# Patient Record
Sex: Female | Born: 1950 | Race: Black or African American | Hispanic: No | Marital: Single | Smoking: Current every day smoker
Health system: Southern US, Community
[De-identification: ages and names within clinical notes are randomized; demographics above are authoritative.]

## PROBLEM LIST (undated history)

## (undated) DIAGNOSIS — J449 Chronic obstructive pulmonary disease, unspecified: Secondary | ICD-10-CM

## (undated) DIAGNOSIS — I10 Essential (primary) hypertension: Secondary | ICD-10-CM

## (undated) HISTORY — PX: FOOT SURGERY: SHX648

---

## 1998-09-01 ENCOUNTER — Inpatient Hospital Stay (HOSPITAL_COMMUNITY): Admission: AD | Admit: 1998-09-01 | Discharge: 1998-09-01 | Payer: Self-pay | Admitting: Obstetrics & Gynecology

## 1998-09-12 ENCOUNTER — Encounter: Admission: RE | Admit: 1998-09-12 | Discharge: 1998-09-12 | Payer: Self-pay | Admitting: Obstetrics & Gynecology

## 1998-09-14 ENCOUNTER — Encounter: Admission: RE | Admit: 1998-09-14 | Discharge: 1998-09-14 | Payer: Self-pay | Admitting: Obstetrics

## 1998-09-25 ENCOUNTER — Ambulatory Visit (HOSPITAL_COMMUNITY): Admission: RE | Admit: 1998-09-25 | Discharge: 1998-09-25 | Payer: Self-pay | Admitting: Obstetrics

## 1998-09-28 ENCOUNTER — Ambulatory Visit (HOSPITAL_COMMUNITY): Admission: RE | Admit: 1998-09-28 | Discharge: 1998-09-28 | Payer: Self-pay | Admitting: Obstetrics

## 2001-12-05 ENCOUNTER — Encounter: Payer: Self-pay | Admitting: Emergency Medicine

## 2001-12-05 ENCOUNTER — Emergency Department (HOSPITAL_COMMUNITY): Admission: EM | Admit: 2001-12-05 | Discharge: 2001-12-05 | Payer: Self-pay | Admitting: Emergency Medicine

## 2002-06-02 ENCOUNTER — Encounter: Payer: Self-pay | Admitting: Internal Medicine

## 2002-06-02 ENCOUNTER — Ambulatory Visit (HOSPITAL_COMMUNITY): Admission: RE | Admit: 2002-06-02 | Discharge: 2002-06-02 | Payer: Self-pay | Admitting: Internal Medicine

## 2002-07-09 ENCOUNTER — Encounter: Payer: Self-pay | Admitting: Emergency Medicine

## 2002-07-09 ENCOUNTER — Emergency Department (HOSPITAL_COMMUNITY): Admission: EM | Admit: 2002-07-09 | Discharge: 2002-07-09 | Payer: Self-pay | Admitting: Emergency Medicine

## 2002-10-13 ENCOUNTER — Encounter: Payer: Self-pay | Admitting: Internal Medicine

## 2002-10-13 ENCOUNTER — Ambulatory Visit (HOSPITAL_COMMUNITY): Admission: RE | Admit: 2002-10-13 | Discharge: 2002-10-13 | Payer: Self-pay | Admitting: Internal Medicine

## 2002-10-25 ENCOUNTER — Emergency Department (HOSPITAL_COMMUNITY): Admission: EM | Admit: 2002-10-25 | Discharge: 2002-10-25 | Payer: Self-pay | Admitting: Emergency Medicine

## 2003-04-02 ENCOUNTER — Emergency Department (HOSPITAL_COMMUNITY): Admission: EM | Admit: 2003-04-02 | Discharge: 2003-04-02 | Payer: Self-pay | Admitting: Emergency Medicine

## 2007-06-24 ENCOUNTER — Emergency Department (HOSPITAL_COMMUNITY): Admission: EM | Admit: 2007-06-24 | Discharge: 2007-06-24 | Payer: Self-pay | Admitting: Emergency Medicine

## 2007-07-10 ENCOUNTER — Emergency Department (HOSPITAL_COMMUNITY): Admission: EM | Admit: 2007-07-10 | Discharge: 2007-07-10 | Payer: Self-pay | Admitting: Emergency Medicine

## 2008-05-17 ENCOUNTER — Emergency Department (HOSPITAL_COMMUNITY): Admission: EM | Admit: 2008-05-17 | Discharge: 2008-05-17 | Payer: Self-pay | Admitting: Family Medicine

## 2008-06-03 ENCOUNTER — Emergency Department (HOSPITAL_COMMUNITY): Admission: EM | Admit: 2008-06-03 | Discharge: 2008-06-03 | Payer: Self-pay | Admitting: Family Medicine

## 2009-03-10 ENCOUNTER — Emergency Department (HOSPITAL_COMMUNITY): Admission: EM | Admit: 2009-03-10 | Discharge: 2009-03-10 | Payer: Self-pay | Admitting: Emergency Medicine

## 2009-07-31 ENCOUNTER — Emergency Department (HOSPITAL_COMMUNITY): Admission: EM | Admit: 2009-07-31 | Discharge: 2009-07-31 | Payer: Self-pay | Admitting: Emergency Medicine

## 2009-08-07 ENCOUNTER — Emergency Department (HOSPITAL_COMMUNITY)
Admission: EM | Admit: 2009-08-07 | Discharge: 2009-08-07 | Payer: Self-pay | Source: Home / Self Care | Admitting: Emergency Medicine

## 2010-01-02 ENCOUNTER — Emergency Department (HOSPITAL_COMMUNITY)
Admission: EM | Admit: 2010-01-02 | Discharge: 2010-01-02 | Payer: Self-pay | Source: Home / Self Care | Admitting: Family Medicine

## 2010-06-08 ENCOUNTER — Ambulatory Visit (HOSPITAL_BASED_OUTPATIENT_CLINIC_OR_DEPARTMENT_OTHER): Admission: RE | Admit: 2010-06-08 | Payer: Medicare Other | Source: Ambulatory Visit | Admitting: Surgery

## 2010-06-10 ENCOUNTER — Ambulatory Visit (INDEPENDENT_AMBULATORY_CARE_PROVIDER_SITE_OTHER): Payer: Medicare Other

## 2010-06-10 ENCOUNTER — Inpatient Hospital Stay (INDEPENDENT_AMBULATORY_CARE_PROVIDER_SITE_OTHER)
Admission: RE | Admit: 2010-06-10 | Discharge: 2010-06-10 | Disposition: A | Discharge status: Home / Self Care | Payer: Medicare Other | Source: Ambulatory Visit | Attending: Emergency Medicine | Admitting: Emergency Medicine

## 2010-06-10 DIAGNOSIS — M25519 Pain in unspecified shoulder: Secondary | ICD-10-CM

## 2010-10-04 LAB — POCT RAPID STREP A: Streptococcus, Group A Screen (Direct): NEGATIVE

## 2010-11-01 ENCOUNTER — Emergency Department (INDEPENDENT_AMBULATORY_CARE_PROVIDER_SITE_OTHER): Payer: Medicare Other

## 2010-11-01 ENCOUNTER — Encounter: Payer: Self-pay | Admitting: *Deleted

## 2010-11-01 ENCOUNTER — Emergency Department (HOSPITAL_BASED_OUTPATIENT_CLINIC_OR_DEPARTMENT_OTHER)
Admission: EM | Admit: 2010-11-01 | Discharge: 2010-11-01 | Disposition: A | Discharge status: Home / Self Care | Payer: Medicare Other | Attending: Emergency Medicine | Admitting: Emergency Medicine

## 2010-11-01 DIAGNOSIS — M79609 Pain in unspecified limb: Secondary | ICD-10-CM | POA: Insufficient documentation

## 2010-11-01 DIAGNOSIS — R0781 Pleurodynia: Secondary | ICD-10-CM

## 2010-11-01 DIAGNOSIS — J4489 Other specified chronic obstructive pulmonary disease: Secondary | ICD-10-CM | POA: Insufficient documentation

## 2010-11-01 DIAGNOSIS — S139XXA Sprain of joints and ligaments of unspecified parts of neck, initial encounter: Secondary | ICD-10-CM | POA: Insufficient documentation

## 2010-11-01 DIAGNOSIS — M79659 Pain in unspecified thigh: Secondary | ICD-10-CM

## 2010-11-01 DIAGNOSIS — R0789 Other chest pain: Secondary | ICD-10-CM

## 2010-11-01 DIAGNOSIS — F17200 Nicotine dependence, unspecified, uncomplicated: Secondary | ICD-10-CM | POA: Insufficient documentation

## 2010-11-01 DIAGNOSIS — Y92410 Unspecified street and highway as the place of occurrence of the external cause: Secondary | ICD-10-CM | POA: Insufficient documentation

## 2010-11-01 DIAGNOSIS — I10 Essential (primary) hypertension: Secondary | ICD-10-CM | POA: Insufficient documentation

## 2010-11-01 DIAGNOSIS — R079 Chest pain, unspecified: Secondary | ICD-10-CM | POA: Insufficient documentation

## 2010-11-01 DIAGNOSIS — J449 Chronic obstructive pulmonary disease, unspecified: Secondary | ICD-10-CM | POA: Insufficient documentation

## 2010-11-01 DIAGNOSIS — M47812 Spondylosis without myelopathy or radiculopathy, cervical region: Secondary | ICD-10-CM

## 2010-11-01 DIAGNOSIS — M542 Cervicalgia: Secondary | ICD-10-CM

## 2010-11-01 DIAGNOSIS — S161XXA Strain of muscle, fascia and tendon at neck level, initial encounter: Secondary | ICD-10-CM

## 2010-11-01 HISTORY — DX: Chronic obstructive pulmonary disease, unspecified: J44.9

## 2010-11-01 HISTORY — DX: Essential (primary) hypertension: I10

## 2010-11-01 MED ORDER — HYDROCODONE-ACETAMINOPHEN 5-325 MG PO TABS
2.0000 | ORAL_TABLET | Freq: Once | ORAL | Status: AC
  Administered 2010-11-01: 2 via ORAL
  Filled 2010-11-01: qty 2

## 2010-11-01 MED ORDER — HYDROCODONE-ACETAMINOPHEN 5-500 MG PO TABS: 1.0000 | ORAL_TABLET | Freq: Four times a day (QID) | ORAL | Status: AC | PRN

## 2010-11-01 NOTE — ED Notes (Signed)
C-Collar was removed per negative xrays. No change in status. Pt reports "stiffness" but no change in neuro status.

## 2010-11-01 NOTE — ED Notes (Signed)
Pt now report pain to left lateral thigh. Swelling noted. Pt ambulates without difficulty.
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
## 2010-11-01 NOTE — ED Notes (Addendum)
Pt was logrolled off backboard. C-collar remains in place. No change in neuro vascular status.

## 2010-11-01 NOTE — ED Notes (Signed)
Pt seen ambulating around department checking on other family members. Ambulates without difficulty. Does not appear to be in any acute discomfort.

## 2010-11-01 NOTE — ED Notes (Signed)
Pt up ambulating through dept with a steady gait with C-collar in place. NAD noted.

## 2010-11-01 NOTE — ED Notes (Signed)
Pt requesting pain med to take now.

## 2010-11-02 NOTE — ED Provider Notes (Signed)
Medical screening examination/treatment/procedure(s) were performed by non-physician practitioner and as supervising physician I was immediately available for consultation/collaboration.  Glynn Octave, MD 11/02/10 [phone]

## 2012-01-05 ENCOUNTER — Emergency Department (INDEPENDENT_AMBULATORY_CARE_PROVIDER_SITE_OTHER)
Admission: EM | Admit: 2012-01-05 | Discharge: 2012-01-05 | Disposition: A | Discharge status: Home / Self Care | Payer: Medicare Other | Source: Home / Self Care | Attending: Emergency Medicine | Admitting: Emergency Medicine

## 2012-01-05 ENCOUNTER — Encounter (HOSPITAL_COMMUNITY): Payer: Self-pay | Admitting: *Deleted

## 2012-01-05 ENCOUNTER — Emergency Department (INDEPENDENT_AMBULATORY_CARE_PROVIDER_SITE_OTHER): Payer: Medicare Other

## 2012-01-05 DIAGNOSIS — M549 Dorsalgia, unspecified: Secondary | ICD-10-CM

## 2012-01-05 MED ORDER — HYDROCODONE-ACETAMINOPHEN 5-325 MG PO TABS
ORAL_TABLET | ORAL | Status: AC
  Filled 2012-01-05: qty 2

## 2012-01-05 MED ORDER — CYCLOBENZAPRINE HCL 10 MG PO TABS: 10.0000 mg | ORAL_TABLET | Freq: Two times a day (BID) | ORAL | Status: DC | PRN

## 2012-01-05 MED ORDER — HYDROCODONE-ACETAMINOPHEN 5-325 MG PO TABS
2.0000 | ORAL_TABLET | Freq: Once | ORAL | Status: AC
  Administered 2012-01-05: 2 via ORAL

## 2012-01-05 MED ORDER — HYDROCODONE-ACETAMINOPHEN 5-325 MG PO TABS: 2.0000 | ORAL_TABLET | ORAL | Status: DC | PRN

## 2012-01-05 NOTE — ED Provider Notes (Signed)
Medical screening examination/treatment/procedure(s) were performed by non-physician practitioner and as supervising physician I was immediately available for consultation/collaboration.  Leslee Home, M.D.   Reuben Likes, MD 01/05/12 [phone]

## 2012-01-05 NOTE — ED Notes (Signed)
Patient complains of left leg pain that starts at lower back and radiates to lower left leg. Pain started yesterday morning and gradually increased. Pain is worse while standing and walking. Patient denies Nausea, vomiting, diarrhea. Patient states she wants to get a refill on her Combivent.

## 2012-01-05 NOTE — ED Provider Notes (Signed)
History     CSN: [id number]  Arrival date & time 01/05/12  1242   First MD Initiated Contact with Patient 01/05/12 1450      Chief Complaint  Patient presents with  . Leg Pain    (Consider location/radiation/quality/duration/timing/severity/associated sxs/prior treatment) Patient is a 61 y.o. female presenting with back pain.  Back Pain  This is a new problem. The problem occurs constantly. The problem has been gradually worsening. The pain is associated with an MCA. The pain is present in the lumbar spine. The quality of the pain is described as aching and cramping. The pain is at a severity of 8/10. The pain is moderate. She has tried nothing for the symptoms. The treatment provided no relief.    Past Medical History  Diagnosis Date  . Asthma   . Hypertension   . COPD (chronic obstructive pulmonary disease)     Past Surgical History  Procedure Date  . Foot surgery     No family history on file.  History  Substance Use Topics  . Smoking status: Current Every Day Smoker  . Smokeless tobacco: Not on file  . Alcohol Use: Yes     Comment: rarely    OB History    Grav Para Term Preterm Abortions TAB SAB Ect Mult Living                  Review of Systems  Musculoskeletal: Positive for back pain.  All other systems reviewed and are negative.    Allergies  Ampicillin; Aspirin; and Penicillins  Home Medications   Current Outpatient Rx  Name  Route  Sig  Dispense  Refill  . IPRATROPIUM-ALBUTEROL 18-103 MCG/ACT IN AERO   Inhalation   Inhale 2 puffs into the lungs every 6 (six) hours as needed. For shortness of breath          . ESOMEPRAZOLE MAGNESIUM 40 MG PO CPDR   Oral   Take 40 mg by mouth daily.           Marland Kitchen HYDROCHLOROTHIAZIDE PO   Oral   Take 1 tablet by mouth daily.           . VESICARE PO   Oral   Take 1 tablet by mouth daily.             BP 133/78  Pulse 62  Temp 97.7 F (36.5 C) (Oral)  Resp 19  SpO2 98%  Physical Exam    Nursing note and vitals reviewed. Constitutional: She is oriented to person, place, and time. She appears well-developed and well-nourished.  HENT:  Head: Normocephalic.  Right Ear: External ear normal.  Left Ear: External ear normal.  Nose: Nose normal.  Mouth/Throat: Oropharynx is clear and moist.  Eyes: Conjunctivae normal and EOM are normal. Pupils are equal, round, and reactive to light.  Neck: Normal range of motion. Neck supple.  Cardiovascular: Normal rate and normal heart sounds.   Pulmonary/Chest: Effort normal and breath sounds normal.  Abdominal: Soft. Bowel sounds are normal.  Musculoskeletal: Normal range of motion.  Neurological: She is alert and oriented to person, place, and time.  Skin: Skin is warm.  Psychiatric: She has a normal mood and affect.    ED Course  Procedures (including critical care time)  Labs Reviewed - No data to display Dg Lumbar Spine Complete  01/05/2012  *RADIOLOGY REPORT*  Clinical Data: Leg pain.  Sudden onset of lower back pain 1 day ago.  No known injury.  LUMBAR SPINE -  COMPLETE 4+ VIEW  Comparison: None.  Findings: There are five non-rib bearing vertebral bodies.  There is grade 1 retrolisthesis of L5 on S1.  No evidence for acute fracture.  No suspicious lytic or blastic lesions are identified. Note is made of moderate stool burden within the visualized colonic loops.  Visualized portion of the pelvis is unremarkable in appearance.  IMPRESSION:  1.  Grade 1 retrolisthesis of L5 on S1. 2.  No evidence for acute fracture.   Original Report Authenticated By: Norva Pavlov, M.D.      1. Back pain       MDM  Rx for hydrocodone,   Flexeril for spasm.   Follow up with [facility] for evaluation       Elson Areas, [place] 01/05/12 1653

## 2018-01-08 DIAGNOSIS — Z79899 Other long term (current) drug therapy: Secondary | ICD-10-CM | POA: Insufficient documentation

## 2018-01-08 DIAGNOSIS — J449 Chronic obstructive pulmonary disease, unspecified: Secondary | ICD-10-CM | POA: Diagnosis not present

## 2018-01-08 DIAGNOSIS — M545 Low back pain: Secondary | ICD-10-CM | POA: Diagnosis present

## 2018-01-08 DIAGNOSIS — R51 Headache: Secondary | ICD-10-CM | POA: Diagnosis not present

## 2018-01-08 DIAGNOSIS — F17200 Nicotine dependence, unspecified, uncomplicated: Secondary | ICD-10-CM | POA: Diagnosis not present

## 2018-01-08 DIAGNOSIS — J45909 Unspecified asthma, uncomplicated: Secondary | ICD-10-CM | POA: Diagnosis not present

## 2018-01-08 DIAGNOSIS — M542 Cervicalgia: Secondary | ICD-10-CM | POA: Insufficient documentation

## 2018-01-08 DIAGNOSIS — I10 Essential (primary) hypertension: Secondary | ICD-10-CM | POA: Diagnosis not present

## 2018-01-09 ENCOUNTER — Emergency Department (HOSPITAL_COMMUNITY): Payer: 59

## 2018-01-09 ENCOUNTER — Emergency Department (HOSPITAL_COMMUNITY)
Admission: EM | Admit: 2018-01-09 | Discharge: 2018-01-09 | Disposition: A | Discharge status: Home / Self Care | Payer: 59 | Attending: Emergency Medicine | Admitting: Emergency Medicine

## 2018-01-09 ENCOUNTER — Encounter (HOSPITAL_COMMUNITY): Payer: Self-pay | Admitting: *Deleted

## 2018-01-09 ENCOUNTER — Other Ambulatory Visit: Payer: Self-pay

## 2018-01-09 DIAGNOSIS — M545 Low back pain, unspecified: Secondary | ICD-10-CM

## 2018-01-09 MED ORDER — AZITHROMYCIN 250 MG PO TABS: 250.0000 mg | ORAL_TABLET | Freq: Every day | ORAL | 0 refills | Status: DC

## 2018-01-09 MED ORDER — HYDROCODONE-ACETAMINOPHEN 5-325 MG PO TABS: 1.0000 | ORAL_TABLET | Freq: Four times a day (QID) | ORAL | 0 refills | Status: DC | PRN

## 2018-01-09 MED ORDER — HYDROCODONE-ACETAMINOPHEN 5-325 MG PO TABS
2.0000 | ORAL_TABLET | Freq: Once | ORAL | Status: AC
Start: 2018-01-09 — End: 2018-01-09
  Administered 2018-01-09: 2 via ORAL
  Filled 2018-01-09: qty 2

## 2018-01-09 MED ORDER — DOXYCYCLINE HYCLATE 100 MG PO CAPS: 100.0000 mg | ORAL_CAPSULE | Freq: Two times a day (BID) | ORAL | 0 refills | Status: AC
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
## 2018-01-09 NOTE — ED Notes (Signed)
Bed: [facility] Expected date:  Expected time:  Means of arrival:  Comments: 

## 2018-01-09 NOTE — ED Notes (Signed)
Pt removed C-collar c/o "it's digging in my face."  Pt informed of reason for C-collar & accepted responsibility for removing it.

## 2019-02-05 ENCOUNTER — Emergency Department (HOSPITAL_BASED_OUTPATIENT_CLINIC_OR_DEPARTMENT_OTHER)
Admission: EM | Admit: 2019-02-05 | Discharge: 2019-02-05 | Disposition: A | Discharge status: Home / Self Care | Payer: 59 | Attending: Emergency Medicine | Admitting: Emergency Medicine

## 2019-02-05 ENCOUNTER — Encounter (HOSPITAL_BASED_OUTPATIENT_CLINIC_OR_DEPARTMENT_OTHER): Payer: Self-pay

## 2019-02-05 ENCOUNTER — Other Ambulatory Visit: Payer: Self-pay

## 2019-02-05 DIAGNOSIS — F17210 Nicotine dependence, cigarettes, uncomplicated: Secondary | ICD-10-CM | POA: Insufficient documentation

## 2019-02-05 DIAGNOSIS — E876 Hypokalemia: Secondary | ICD-10-CM | POA: Insufficient documentation

## 2019-02-05 DIAGNOSIS — J45909 Unspecified asthma, uncomplicated: Secondary | ICD-10-CM | POA: Insufficient documentation

## 2019-02-05 DIAGNOSIS — Z20822 Contact with and (suspected) exposure to covid-19: Secondary | ICD-10-CM | POA: Insufficient documentation

## 2019-02-05 DIAGNOSIS — B349 Viral infection, unspecified: Secondary | ICD-10-CM

## 2019-02-05 DIAGNOSIS — I10 Essential (primary) hypertension: Secondary | ICD-10-CM | POA: Insufficient documentation

## 2019-02-05 DIAGNOSIS — R7989 Other specified abnormal findings of blood chemistry: Secondary | ICD-10-CM

## 2019-02-05 DIAGNOSIS — J449 Chronic obstructive pulmonary disease, unspecified: Secondary | ICD-10-CM | POA: Insufficient documentation

## 2019-02-05 DIAGNOSIS — R5383 Other fatigue: Secondary | ICD-10-CM | POA: Diagnosis present

## 2019-02-05 LAB — URINALYSIS, ROUTINE W REFLEX MICROSCOPIC
Bilirubin Urine: NEGATIVE
Glucose, UA: NEGATIVE mg/dL
Hgb urine dipstick: NEGATIVE
Ketones, ur: NEGATIVE mg/dL
Leukocytes,Ua: NEGATIVE
Nitrite: NEGATIVE
Protein, ur: NEGATIVE mg/dL
Specific Gravity, Urine: 1.01 (ref 1.005–1.030)
pH: 7.5 (ref 5.0–8.0)

## 2019-02-05 LAB — CBC WITH DIFFERENTIAL/PLATELET
Abs Immature Granulocytes: 0.01 10*3/uL (ref 0.00–0.07)
Basophils Absolute: 0 10*3/uL (ref 0.0–0.1)
Basophils Relative: 0 %
Eosinophils Absolute: 0 10*3/uL (ref 0.0–0.5)
Eosinophils Relative: 0 %
HCT: 42.2 % (ref 36.0–46.0)
Hemoglobin: 13.9 g/dL (ref 12.0–15.0)
Immature Granulocytes: 0 %
Lymphocytes Relative: 34 %
Lymphs Abs: 1.3 10*3/uL (ref 0.7–4.0)
MCH: 30 pg (ref 26.0–34.0)
MCHC: 32.9 g/dL (ref 30.0–36.0)
MCV: 91.1 fL (ref 80.0–100.0)
Monocytes Absolute: 0.3 10*3/uL (ref 0.1–1.0)
Monocytes Relative: 8 %
Neutro Abs: 2.2 10*3/uL (ref 1.7–7.7)
Neutrophils Relative %: 58 %
Platelets: 164 10*3/uL (ref 150–400)
RBC: 4.63 MIL/uL (ref 3.87–5.11)
RDW: 12.9 % (ref 11.5–15.5)
WBC: 3.8 10*3/uL — ABNORMAL LOW (ref 4.0–10.5)
nRBC: 0 % (ref 0.0–0.2)

## 2019-02-05 LAB — BASIC METABOLIC PANEL
Anion gap: 9 (ref 5–15)
BUN: 15 mg/dL (ref 8–23)
CO2: 30 mmol/L (ref 22–32)
Calcium: 8.3 mg/dL — ABNORMAL LOW (ref 8.9–10.3)
Chloride: 96 mmol/L — ABNORMAL LOW (ref 98–111)
Creatinine, Ser: 1.06 mg/dL — ABNORMAL HIGH (ref 0.44–1.00)
GFR calc Af Amer: >60 mL/min (ref >60)
GFR calc non Af Amer: 54 mL/min — ABNORMAL LOW (ref >60)
Glucose, Bld: 94 mg/dL (ref 70–99)
Potassium: 2.8 mmol/L — ABNORMAL LOW (ref 3.5–5.1)
Sodium: 135 mmol/L (ref 135–145)

## 2019-02-05 LAB — SARS CORONAVIRUS 2 AG (30 MIN TAT): SARS Coronavirus 2 Ag: NEGATIVE

## 2019-02-05 MED ORDER — SODIUM CHLORIDE 0.9 % IV BOLUS
500.0000 mL | Freq: Once | INTRAVENOUS | Status: AC
  Administered 2019-02-05: 500 mL via INTRAVENOUS

## 2019-02-05 MED ORDER — POTASSIUM CHLORIDE 10 MEQ/100ML IV SOLN
10.0000 meq | Freq: Once | INTRAVENOUS | Status: AC
  Administered 2019-02-05: 10 meq via INTRAVENOUS
  Filled 2019-02-05: qty 100

## 2019-02-05 MED ORDER — POTASSIUM CHLORIDE ER 10 MEQ PO TBCR: 10.0000 meq | EXTENDED_RELEASE_TABLET | Freq: Every day | ORAL | 0 refills | Status: AC

## 2019-02-05 NOTE — ED Triage Notes (Signed)
Pt arrives to ED with reports of fatigue and diarrhea X3 days. Pt states that last time this happened she was dehydrated.

## 2019-02-05 NOTE — ED Provider Notes (Signed)
[facility] EMERGENCY DEPARTMENT Provider Note   CSN: [id number] Arrival date & time: 02/05/19  1229     History Chief Complaint  Patient presents with  . Fatigue    Kimberly Gibbs is a 69 y.o. female with PMHx asthma, COPD, and HTN who presents to the ED today complaining of gradual onset, constant, fatigue x 2-3 days.  She also complains of chills, nasal congestion, diarrhea.  She does report she has a history of irritable bowel syndrome and states that she does not believe her diarrhea is any worse than normal.  No blood in stool.  States she has been checking her temperature at home and states she has not had a fever.  She reports that approximately 4 years ago she had similar symptoms and was told that she was dehydrated, patient states she thinks she needs fluids.  Denies any recent COVID-19 positive exposure.  Denies fever, headache, cough, shortness of breath, chest pain, abdominal pain, nausea, vomiting, other associated symptoms.   The history is provided by the patient.       Past Medical History:  Diagnosis Date  . Asthma   . COPD (chronic obstructive pulmonary disease) (HCC)   . Hypertension     There are no problems to display for this patient.   Past Surgical History:  Procedure Laterality Date  . FOOT SURGERY       OB History   No obstetric history on file.     No family history on file.  Social History   Tobacco Use  . Smoking status: Current Every Day Smoker    Packs/day: 0.50    Types: Cigarettes  . Smokeless tobacco: Never Used  Substance Use Topics  . Alcohol use: Yes    Comment: rarely  . Drug use: No    Home Medications Prior to Admission medications   Medication Sig Start Date End Date Taking? Authorizing Provider  albuterol-ipratropium (COMBIVENT) 18-103 MCG/ACT inhaler Inhale 2 puffs into the lungs every 6 (six) hours as needed. For shortness of breath     [provider]  cyclobenzaprine (FLEXERIL) 10 MG tablet  Take 1 tablet (10 mg total) by mouth 2 (two) times daily as needed for muscle spasms. 01/05/12   Elson Areas, PA-C  esomeprazole (NEXIUM) 40 MG capsule Take 40 mg by mouth daily.      [provider]  HYDROCHLOROTHIAZIDE PO Take 1 tablet by mouth daily.      [provider]  HYDROcodone-acetaminophen (NORCO/VICODIN) 5-325 MG tablet Take 1 tablet by mouth every 6 (six) hours as needed for severe pain. 01/09/18   Shaune Pollack, MD  potassium chloride (KLOR-CON) 10 MEQ tablet Take 1 tablet (10 mEq total) by mouth daily for 7 days. 02/05/19 02/12/19  Tanda Rockers, PA-C  Solifenacin Succinate (VESICARE PO) Take 1 tablet by mouth daily.      [provider]    Allergies    Ampicillin, Aspirin, and Penicillins  Review of Systems   Review of Systems  Constitutional: Positive for activity change, chills and fatigue. Negative for fever.  HENT: Positive for congestion.   Respiratory: Negative for cough and shortness of breath.   Cardiovascular: Negative for chest pain.  Gastrointestinal: Positive for diarrhea. Negative for abdominal pain, nausea and vomiting.  All other systems reviewed and are negative.   Physical Exam Updated Vital Signs BP (!) 103/48 (BP Location: Right Arm)   Pulse 81   Temp 98.6 F (37 C) (Oral)   Resp 18  Ht 5\' 5"  (1.651 m)   Wt 95.9 kg   SpO2 94%   BMI 35.18 kg/m   Physical Exam Vitals and nursing note reviewed.  Constitutional:      Appearance: She is not ill-appearing or diaphoretic.  HENT:     Head: Normocephalic and atraumatic.  Eyes:     Conjunctiva/sclera: Conjunctivae normal.  Cardiovascular:     Rate and Rhythm: Normal rate and regular rhythm.     Pulses: Normal pulses.  Pulmonary:     Effort: Pulmonary effort is normal.     Breath sounds: Normal breath sounds. No wheezing, rhonchi or rales.  Abdominal:     Palpations: Abdomen is soft.     Tenderness: There is no abdominal tenderness. There is no guarding or  rebound.  Musculoskeletal:     Cervical back: Neck supple.  Skin:    General: Skin is warm and dry.  Neurological:     Mental Status: She is alert.     ED Results / Procedures / Treatments   Labs (all labs ordered are listed, but only abnormal results are displayed) Labs Reviewed  BASIC METABOLIC PANEL - Abnormal; Notable for the following components:      Result Value   Potassium 2.8 (*)    Chloride 96 (*)    Creatinine, Ser 1.06 (*)    Calcium 8.3 (*)    GFR calc non Af Amer 54 (*)    All other components within normal limits  CBC WITH DIFFERENTIAL/PLATELET - Abnormal; Notable for the following components:   WBC 3.8 (*)    All other components within normal limits  SARS CORONAVIRUS 2 AG (30 MIN TAT)  NOVEL CORONAVIRUS, NAA (HOSP ORDER, SEND-OUT TO REF LAB; TAT 18-24 HRS)  URINALYSIS, ROUTINE W REFLEX MICROSCOPIC    EKG None  Radiology No results found.  Procedures Procedures (including critical care time)  Medications Ordered in ED Medications  sodium chloride 0.9 % bolus 500 mL (500 mLs Intravenous New Bag/Given 02/05/19 1508)  potassium chloride 10 mEq in 100 mL IVPB (10 mEq Intravenous New Bag/Given 02/05/19 1548)    ED Course  I have reviewed the triage vital signs and the nursing notes.  Pertinent labs & imaging results that were available during my care of the patient were reviewed by me and considered in my medical decision making (see chart for details).  69 year old female presents the ED today complaining of fatigue, chills, diarrhea, nasal congestion.  States she thinks that she is dehydrated.  She denies any recent COVID-19 positive exposure however with constellation of symptoms there is concern.  Will swab for Covid at this time, obtain screening labs, urinalysis.  Vitals are stable; pt is afebrile, nontachycardic, and nontachypneic. Satting 95% on RA; no complaints of SOB or cough at this time. blood pressure mildly soft in the ED, will recheck.  May  consider giving a mild amount of fluids however with concern for Covid do want to avoid giving excess fluids.   Recheck blood pressure 83/52; will give 500 CCs fluids.   Clinical Course as of Feb 04 1702  Fri Feb 05, 2019  1514 SARS Coronavirus 2 Ag: NEGATIVE [MV]  1532 Potassium(!): 2.8 [MV]    Clinical Course User Index [MV] 1533, PA-C   Potassium 2.8; will replete in the ED and discharge home with same. Suspect depletion s/2 diarrhea. Creatinine mildly elevated at 1.06; pt receiving fluids.   Rapid covid test negative; will order send out test. Remainder of labwork reassuring.  WBC 3.8; consistent with suspected covid 19. Urine without infection.   On reevaluation pt's most recent blood pressure 114/66. Pt reports she is feeling improved and ready to be discharged. She is advised that she will need to stay home and self isolate until she receives her results. She is in understanding that the test can take 2-3 days to return. I will discharge home with [facility]; pt advised she will need to have her potassium and creatinine level rechecked in 1-2 weeks. If positive she needs to self isolate for 14 days. Stable for discharge.   This note was prepared using Dragon voice recognition software and may include unintentional dictation errors due to the inherent limitations of voice recognition software.  Kimberly Gibbs was evaluated in Emergency Department on 02/05/2019 for the symptoms described in the history of present illness. She was evaluated in the context of the global COVID-19 pandemic, which necessitated consideration that the patient might be at risk for infection with the SARS-CoV-2 virus that causes COVID-19. Institutional protocols and algorithms that pertain to the evaluation of patients at risk for COVID-19 are in a state of rapid change based on information released by regulatory bodies including the CDC and federal and state organizations. These policies and algorithms were  followed during the patient's care in the ED.    MDM Rules/Calculators/A&P                       Final Clinical Impression(s) / ED Diagnoses Final diagnoses:  Person under investigation for COVID-19  Viral illness  Hypokalemia  Elevated serum creatinine    Rx / DC Orders ED Discharge Orders         Ordered    potassium chloride (KLOR-CON) 10 MEQ tablet  Daily     02/05/19 1700           Discharge Instructions     Your potassium was mildly low today - we have given you potassium through the IV and have prescribed additional oral potassium to take at home.   Your kidney function (creatinine) was mildly elevated today; likely due to dehydrated.   Please increase your water intake at home for the next few days. You will need to have both your potassium and creatinine level rechecked by your PCP in 1-2 weeks.   You can take your at home Imodium to help with your diarrhea.   We have swabbed you for COVID 19 today - you need to stay home and self isolate until you receive your results (can take up to 2-2 days to return). If negative you may return to work immediately. If positive you will need to self isolate for 14 days (cleared: 02/20/2019). We will call you if you are positive. You can log into your MyChart account to check your results as well.        Eustaquio Maize, PA-C 02/05/19 [place], MD 02/06/19 [phone]

## 2019-02-05 NOTE — Discharge Instructions (Signed)
Your potassium was mildly low today - we have given you potassium through the IV and have prescribed additional oral potassium to take at home.   Your kidney function (creatinine) was mildly elevated today; likely due to dehydrated.   Please increase your water intake at home for the next few days. You will need to have both your potassium and creatinine level rechecked by your PCP in 1-2 weeks.   You can take your at home Imodium to help with your diarrhea.   We have swabbed you for COVID 19 today - you need to stay home and self isolate until you receive your results (can take up to 2-2 days to return). If negative you may return to work immediately. If positive you will need to self isolate for 14 days (cleared: 02/20/2019). We will call you if you are positive. You can log into your MyChart account to check your results as well.

## 2019-02-06 LAB — NOVEL CORONAVIRUS, NAA (HOSP ORDER, SEND-OUT TO REF LAB; TAT 18-24 HRS): SARS-CoV-2, NAA: NOT DETECTED

## 2019-04-22 ENCOUNTER — Other Ambulatory Visit: Payer: Self-pay | Admitting: Oral Surgery
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
## 2020-01-07 ENCOUNTER — Ambulatory Visit (HOSPITAL_COMMUNITY): Admission: EM | Admit: 2020-01-07 | Discharge: 2020-01-07 | Discharge status: Against Medical Advice | Payer: 59

## 2020-01-08 ENCOUNTER — Other Ambulatory Visit: Payer: Self-pay

## 2020-01-08 ENCOUNTER — Ambulatory Visit (HOSPITAL_COMMUNITY)
Admission: EM | Admit: 2020-01-08 | Discharge: 2020-01-08 | Disposition: A | Discharge status: Home / Self Care | Payer: Medicare Other | Attending: Student | Admitting: Student

## 2020-01-08 ENCOUNTER — Encounter (HOSPITAL_COMMUNITY): Payer: Self-pay | Admitting: *Deleted

## 2020-01-08 DIAGNOSIS — R197 Diarrhea, unspecified: Secondary | ICD-10-CM | POA: Insufficient documentation

## 2020-01-08 DIAGNOSIS — J069 Acute upper respiratory infection, unspecified: Secondary | ICD-10-CM | POA: Insufficient documentation

## 2020-01-08 DIAGNOSIS — J449 Chronic obstructive pulmonary disease, unspecified: Secondary | ICD-10-CM | POA: Insufficient documentation

## 2020-01-08 DIAGNOSIS — F17210 Nicotine dependence, cigarettes, uncomplicated: Secondary | ICD-10-CM | POA: Insufficient documentation

## 2020-01-08 DIAGNOSIS — I10 Essential (primary) hypertension: Secondary | ICD-10-CM | POA: Insufficient documentation

## 2020-01-08 DIAGNOSIS — U071 COVID-19: Secondary | ICD-10-CM | POA: Diagnosis not present

## 2020-01-08 LAB — RESP PANEL BY RT-PCR (FLU A&B, COVID) ARPGX2
Influenza A by PCR: NEGATIVE
Influenza B by PCR: NEGATIVE
SARS Coronavirus 2 by RT PCR: POSITIVE — AB

## 2020-01-08 NOTE — ED Provider Notes (Signed)
[facility]    CSN: [id number] Arrival date & time: 01/08/20  1017      History   Chief Complaint Chief Complaint  Patient presents with  . Diarrhea  . Cough  . Fatigue    HPI Kimberly Gibbs is a 70 y.o. female Presenting for URI symptoms for 4 days. History of asthma, COPD, current smoker, hypertension.  Describes 4 days of symptoms. States 1 day ago she had 3 episodes of diarrhea in 1 day; since then denies n/v/d/abd pain, is able to eat and drink normally. Also endorses occ productive cough that seems more like tickel in her throat. Adamantly denies shortness of breath or chest pian; is using her combivent inhaler without issue. Denies fevers/chills, n/v, shortness of breath, chest pain, congestion, facial pain, teeth pain, headaches, sore throat, loss of taste/smell, swollen lymph nodes, ear pain.  Denies chest pain, shortness of breath, confusion, high fevers.  Fully vaccinated for covid-19.   HPI  Past Medical History:  Diagnosis Date  . Asthma   . COPD (chronic obstructive pulmonary disease) (HCC)   . Hypertension     There are no problems to display for this patient.   Past Surgical History:  Procedure Laterality Date  . FOOT SURGERY      OB History   No obstetric history on file.      Home Medications    Prior to Admission medications   Medication Sig Start Date End Date Taking? Authorizing Provider  albuterol-ipratropium (COMBIVENT) 18-103 MCG/ACT inhaler Inhale 2 puffs into the lungs every 6 (six) hours as needed. For shortness of breath     [provider]  cyclobenzaprine (FLEXERIL) 10 MG tablet Take 1 tablet (10 mg total) by mouth 2 (two) times daily as needed for muscle spasms. 01/05/12   Elson Areas, PA-C  esomeprazole (NEXIUM) 40 MG capsule Take 40 mg by mouth daily.      [provider]  HYDROCHLOROTHIAZIDE PO Take 1 tablet by mouth daily.      [provider]  HYDROcodone-acetaminophen (NORCO/VICODIN)  5-325 MG tablet Take 1 tablet by mouth every 6 (six) hours as needed for severe pain. 01/09/18   Shaune Pollack, MD  potassium chloride (KLOR-CON) 10 MEQ tablet Take 1 tablet (10 mEq total) by mouth daily for 7 days. 02/05/19 02/12/19  Tanda Rockers, PA-C  Solifenacin Succinate (VESICARE PO) Take 1 tablet by mouth daily.      [provider]    Family History History reviewed. No pertinent family history.  Social History Social History   Tobacco Use  . Smoking status: Current Every Day Smoker    Packs/day: 0.50    Types: Cigarettes  . Smokeless tobacco: Never Used  Substance Use Topics  . Alcohol use: Yes    Comment: rarely  . Drug use: No     Allergies   Aspirin, Latex, Penicillins, and Ampicillin   Review of Systems Review of Systems  Constitutional: Positive for fatigue. Negative for appetite change, chills and fever.  HENT: Negative for congestion, ear pain, rhinorrhea, sinus pressure, sinus pain and sore throat.   Eyes: Negative for redness and visual disturbance.  Respiratory: Positive for cough. Negative for chest tightness, shortness of breath and wheezing.   Cardiovascular: Negative for chest pain and palpitations.  Gastrointestinal: Positive for diarrhea. Negative for abdominal pain, constipation, nausea and vomiting.  Genitourinary: Negative for dysuria, frequency and urgency.  Musculoskeletal: Negative for myalgias.  Neurological: Negative for dizziness, weakness and headaches.  Psychiatric/Behavioral: Negative  for confusion.  All other systems reviewed and are negative.    Physical Exam Triage Vital Signs ED Triage Vitals  Enc Vitals Group     BP 01/08/20 1113 124/79     Pulse Rate 01/08/20 1113 68     Resp 01/08/20 1113 18     Temp 01/08/20 1113 98 F (36.7 C)     Temp Source 01/08/20 1113 Oral     SpO2 01/08/20 1113 98 %     Weight --      Height --      Head Circumference --      Peak Flow --      Pain Score 01/08/20 1115 0     Pain  Loc --      Pain Edu? --      Excl. in GC? --    No data found.  Updated Vital Signs BP 124/79 (BP Location: Right Arm)   Pulse 68   Temp 98 F (36.7 C) (Oral)   Resp 18   SpO2 98%   Visual Acuity Right Eye Distance:   Left Eye Distance:   Bilateral Distance:    Right Eye Near:   Left Eye Near:    Bilateral Near:     Physical Exam Vitals reviewed.  Constitutional:      General: She is not in acute distress.    Appearance: Normal appearance. She is not ill-appearing.  HENT:     Head: Normocephalic and atraumatic.     Right Ear: Hearing, tympanic membrane, ear canal and external ear normal. No swelling or tenderness. There is no impacted cerumen. No mastoid tenderness. Tympanic membrane is not perforated, erythematous, retracted or bulging.     Left Ear: Hearing, tympanic membrane, ear canal and external ear normal. No swelling or tenderness. There is no impacted cerumen. No mastoid tenderness. Tympanic membrane is not perforated, erythematous, retracted or bulging.     Nose:     Right Sinus: No maxillary sinus tenderness or frontal sinus tenderness.     Left Sinus: No maxillary sinus tenderness or frontal sinus tenderness.     Mouth/Throat:     Mouth: Mucous membranes are moist.     Pharynx: Uvula midline. No oropharyngeal exudate or posterior oropharyngeal erythema.     Tonsils: No tonsillar exudate.  Cardiovascular:     Rate and Rhythm: Normal rate and regular rhythm.     Heart sounds: Normal heart sounds.  Pulmonary:     Breath sounds: Normal air entry. Wheezing present. No rhonchi or rales.     Comments: occ wheezes througout Chest:     Chest wall: No tenderness.  Abdominal:     General: Abdomen is flat. Bowel sounds are normal.     Tenderness: There is no abdominal tenderness. There is no right CVA tenderness, left CVA tenderness, guarding or rebound. Negative signs include Murphy's sign, Rovsing's sign and McBurney's sign.  Lymphadenopathy:     Cervical: No  cervical adenopathy.  Neurological:     General: No focal deficit present.     Mental Status: She is alert and oriented to person, place, and time.  Psychiatric:        Attention and Perception: Attention and perception normal.        Mood and Affect: Mood and affect normal.        Behavior: Behavior normal. Behavior is cooperative.        Thought Content: Thought content normal.        Judgment: Judgment normal.  UC Treatments / Results  Labs (all labs ordered are listed, but only abnormal results are displayed) Labs Reviewed  RESP PANEL BY RT-PCR (FLU A&B, COVID) [id number]    EKG   Radiology No results found.  Procedures Procedures (including critical care time)  Medications Ordered in UC Medications - No data to display  Initial Impression / Assessment and Plan / UC Course  I have reviewed the triage vital signs and the nursing notes.  Pertinent labs & imaging results that were available during my care of the patient were reviewed by me and considered in my medical decision making (see chart for details).      afebrile nontachycardic nontachypneic. Oxygenating well on room air. Continue combivent inhaler. Benign lung exam today.  Covid and influenza tests sent today. Patient is fully vaccinated for covid-19. Isolation precautions per CDC guidelines until negative result. Symptomatic relief with OTC Mucinex, Nyquil, etc. Return precautions- new/worsening fevers/chills, shortness of breath, chest pain, abd pain, etc.    Final Clinical Impressions(s) / UC Diagnoses   Final diagnoses:  Viral upper respiratory tract infection     Discharge Instructions     -We'll call you if the result of your covid/influenza test is positive. This tyipcally takes 1-2 days.  -Continue using your combivent inhaler. -Seek additional medical attention if you develop shortness of breath, chest pain, confusion, fevers.     ED Prescriptions    None     PDMP not reviewed this  encounter.   Hazel Sams, PA-C 01/08/20 1208

## 2020-01-08 NOTE — ED Triage Notes (Signed)
Pt reports fatigue ,cough for 4 days.

## 2020-01-08 NOTE — Discharge Instructions (Addendum)
-  We'll call you if the result of your covid/influenza test is positive. This tyipcally takes 1-2 days.  -Continue using your combivent inhaler. -Seek additional medical attention if you develop shortness of breath, chest pain, confusion, fevers.

## 2020-02-21 ENCOUNTER — Other Ambulatory Visit: Payer: Self-pay

## 2020-02-21 ENCOUNTER — Emergency Department (HOSPITAL_BASED_OUTPATIENT_CLINIC_OR_DEPARTMENT_OTHER)
Admission: EM | Admit: 2020-02-21 | Discharge: 2020-02-21 | Disposition: A | Discharge status: Home / Self Care | Payer: Medicare Other | Attending: Emergency Medicine | Admitting: Emergency Medicine

## 2020-02-21 ENCOUNTER — Emergency Department (HOSPITAL_BASED_OUTPATIENT_CLINIC_OR_DEPARTMENT_OTHER): Payer: Medicare Other

## 2020-02-21 ENCOUNTER — Encounter (HOSPITAL_BASED_OUTPATIENT_CLINIC_OR_DEPARTMENT_OTHER): Payer: Self-pay | Admitting: *Deleted

## 2020-02-21 DIAGNOSIS — Z91040 Latex allergy status: Secondary | ICD-10-CM | POA: Diagnosis not present

## 2020-02-21 DIAGNOSIS — J45909 Unspecified asthma, uncomplicated: Secondary | ICD-10-CM | POA: Diagnosis not present

## 2020-02-21 DIAGNOSIS — R072 Precordial pain: Secondary | ICD-10-CM | POA: Diagnosis not present

## 2020-02-21 DIAGNOSIS — R200 Anesthesia of skin: Secondary | ICD-10-CM | POA: Diagnosis not present

## 2020-02-21 DIAGNOSIS — J449 Chronic obstructive pulmonary disease, unspecified: Secondary | ICD-10-CM | POA: Insufficient documentation

## 2020-02-21 DIAGNOSIS — R61 Generalized hyperhidrosis: Secondary | ICD-10-CM | POA: Diagnosis not present

## 2020-02-21 DIAGNOSIS — F17210 Nicotine dependence, cigarettes, uncomplicated: Secondary | ICD-10-CM | POA: Insufficient documentation

## 2020-02-21 DIAGNOSIS — Z79899 Other long term (current) drug therapy: Secondary | ICD-10-CM | POA: Diagnosis not present

## 2020-02-21 DIAGNOSIS — R0789 Other chest pain: Secondary | ICD-10-CM | POA: Diagnosis present

## 2020-02-21 DIAGNOSIS — R110 Nausea: Secondary | ICD-10-CM | POA: Insufficient documentation

## 2020-02-21 DIAGNOSIS — I10 Essential (primary) hypertension: Secondary | ICD-10-CM | POA: Diagnosis not present

## 2020-02-21 DIAGNOSIS — E876 Hypokalemia: Secondary | ICD-10-CM | POA: Insufficient documentation

## 2020-02-21 DIAGNOSIS — R0602 Shortness of breath: Secondary | ICD-10-CM | POA: Insufficient documentation

## 2020-02-21 LAB — COMPREHENSIVE METABOLIC PANEL
ALT: 13 U/L (ref 0–44)
AST: 21 U/L (ref 15–41)
Albumin: 3.9 g/dL (ref 3.5–5.0)
Alkaline Phosphatase: 64 U/L (ref 38–126)
Anion gap: 9 (ref 5–15)
BUN: 20 mg/dL (ref 8–23)
CO2: 30 mmol/L (ref 22–32)
Calcium: 8.9 mg/dL (ref 8.9–10.3)
Chloride: 98 mmol/L (ref 98–111)
Creatinine, Ser: 1.09 mg/dL — ABNORMAL HIGH (ref 0.44–1.00)
GFR, Estimated: 55 mL/min — ABNORMAL LOW (ref >60)
Glucose, Bld: 94 mg/dL (ref 70–99)
Potassium: 2.5 mmol/L — CL (ref 3.5–5.1)
Sodium: 137 mmol/L (ref 135–145)
Total Bilirubin: 0.3 mg/dL (ref 0.3–1.2)
Total Protein: 7 g/dL (ref 6.5–8.1)

## 2020-02-21 LAB — CBC WITH DIFFERENTIAL/PLATELET
Abs Immature Granulocytes: 0.01 10*3/uL (ref 0.00–0.07)
Basophils Absolute: 0 10*3/uL (ref 0.0–0.1)
Basophils Relative: 1 %
Eosinophils Absolute: 0.1 10*3/uL (ref 0.0–0.5)
Eosinophils Relative: 2 %
HCT: 38.9 % (ref 36.0–46.0)
Hemoglobin: 12.6 g/dL (ref 12.0–15.0)
Immature Granulocytes: 0 %
Lymphocytes Relative: 33 %
Lymphs Abs: 1.6 10*3/uL (ref 0.7–4.0)
MCH: 29.9 pg (ref 26.0–34.0)
MCHC: 32.4 g/dL (ref 30.0–36.0)
MCV: 92.2 fL (ref 80.0–100.0)
Monocytes Absolute: 0.4 10*3/uL (ref 0.1–1.0)
Monocytes Relative: 8 %
Neutro Abs: 2.7 10*3/uL (ref 1.7–7.7)
Neutrophils Relative %: 56 %
Platelets: 232 10*3/uL (ref 150–400)
RBC: 4.22 MIL/uL (ref 3.87–5.11)
RDW: 13.4 % (ref 11.5–15.5)
WBC: 4.8 10*3/uL (ref 4.0–10.5)
nRBC: 0 % (ref 0.0–0.2)

## 2020-02-21 LAB — LIPASE, BLOOD: Lipase: 37 U/L (ref 11–51)

## 2020-02-21 LAB — MAGNESIUM: Magnesium: 1.9 mg/dL (ref 1.7–2.4)

## 2020-02-21 LAB — TROPONIN I (HIGH SENSITIVITY)
Troponin I (High Sensitivity): 5 ng/L (ref <18)
Troponin I (High Sensitivity): 5 ng/L (ref <18)

## 2020-02-21 LAB — D-DIMER, QUANTITATIVE: D-Dimer, Quant: 0.59 ug/mL-FEU — ABNORMAL HIGH (ref 0.00–0.50)

## 2020-02-21 LAB — BRAIN NATRIURETIC PEPTIDE: B Natriuretic Peptide: 46.4 pg/mL (ref 0.0–100.0)

## 2020-02-21 MED ORDER — NITROGLYCERIN 0.4 MG SL SUBL
0.4000 mg | SUBLINGUAL_TABLET | SUBLINGUAL | Status: DC | PRN
  Administered 2020-02-21: 0.4 mg via SUBLINGUAL
  Filled 2020-02-21: qty 1

## 2020-02-21 MED ORDER — IOHEXOL 350 MG/ML SOLN
100.0000 mL | Freq: Once | INTRAVENOUS | Status: AC | PRN
  Administered 2020-02-21: 100 mL via INTRAVENOUS

## 2020-02-21 MED ORDER — POTASSIUM CHLORIDE CRYS ER 20 MEQ PO TBCR
40.0000 meq | EXTENDED_RELEASE_TABLET | Freq: Once | ORAL | Status: AC
  Administered 2020-02-21: 40 meq via ORAL
  Filled 2020-02-21: qty 2

## 2020-02-21 MED ORDER — POTASSIUM CHLORIDE CRYS ER 20 MEQ PO TBCR: 40.0000 meq | EXTENDED_RELEASE_TABLET | Freq: Two times a day (BID) | ORAL | 0 refills | Status: AC

## 2020-02-21 NOTE — ED Notes (Signed)
Attempted x 1 in Rt AC and x 1 in Lt AC to establish IV with Ultrasound. EDP informed.

## 2020-02-21 NOTE — ED Notes (Signed)
Presents with chest heaviness, states onset was yesterday at approx 1630 hrs. Took one (1) SL NTG and had immediate relief, also took one ASA 325mg . Is a patient with [facility] here in [facility]. States during chest pain episode had some nausea. Also stated that stress will often bring on her chest pain

## 2020-02-21 NOTE — Discharge Instructions (Signed)
Your work-up today did show your potassium was very low.  We offered IV potassium but you did not want it.  You also did not want to discuss the possibility of admission for your chest pain which we were concerned was related to your heart.  Please follow-up with your cardiologist and your primary doctor.  Your heart enzymes were reassuring both times we checked them.  Your CT scan did not show blood clot in the lungs.  Please double your potassium for the next  3 days and have your potassium rechecked by your PCP in several days.  If you start feeling more chest pain, palpitations, or new symptoms, please return to the nearest emergency department immediately.  Low potassium and chest pain are to very concerning findings which we could have admitted you for today but you did not want this.

## 2020-02-21 NOTE — ED Provider Notes (Signed)
[facility] EMERGENCY DEPARTMENT Provider Note   CSN: [id number] Arrival date & time: 02/21/20  1430     History Chief Complaint  Patient presents with  . Chest Pain    Kimberly Gibbs is a 70 y.o. female.  The history is provided by the patient and medical records. No language interpreter was used.  Chest Pain Pain location:  L chest and substernal area Pain quality: aching, crushing and pressure   Pain radiates to:  L shoulder and L arm Pain severity:  Moderate Onset quality:  Gradual Duration:  2 days Timing:  Constant Progression:  Waxing and waning Chronicity:  Recurrent Relieved by:  Aspirin and nitroglycerin Ineffective treatments:  None tried Associated symptoms: diaphoresis, nausea, numbness and shortness of breath   Associated symptoms: no abdominal pain, no altered mental status, no anxiety, no back pain, no cough, no fatigue, no fever, no headache, no palpitations, no vomiting and no weakness        Past Medical History:  Diagnosis Date  . Asthma   . COPD (chronic obstructive pulmonary disease) (HCC)   . Hypertension     There are no problems to display for this patient.   Past Surgical History:  Procedure Laterality Date  . FOOT SURGERY       OB History   No obstetric history on file.     No family history on file.  Social History   Tobacco Use  . Smoking status: Current Every Day Smoker    Packs/day: 0.50    Types: Cigarettes  . Smokeless tobacco: Never Used  Substance Use Topics  . Alcohol use: Yes    Comment: rarely  . Drug use: No    Home Medications Prior to Admission medications   Medication Sig Start Date End Date Taking? Authorizing Provider  albuterol-ipratropium (COMBIVENT) 18-103 MCG/ACT inhaler Inhale 2 puffs into the lungs every 6 (six) hours as needed. For shortness of breath     [provider]  cyclobenzaprine (FLEXERIL) 10 MG tablet Take 1 tablet (10 mg total) by mouth 2 (two) times daily as  needed for muscle spasms. 01/05/12   Elson Areas, PA-C  esomeprazole (NEXIUM) 40 MG capsule Take 40 mg by mouth daily.      [provider]  HYDROCHLOROTHIAZIDE PO Take 1 tablet by mouth daily.      [provider]  HYDROcodone-acetaminophen (NORCO/VICODIN) 5-325 MG tablet Take 1 tablet by mouth every 6 (six) hours as needed for severe pain. 01/09/18   Shaune Pollack, MD  potassium chloride (KLOR-CON) 10 MEQ tablet Take 1 tablet (10 mEq total) by mouth daily for 7 days. 02/05/19 02/12/19  Tanda Rockers, PA-C  Solifenacin Succinate (VESICARE PO) Take 1 tablet by mouth daily.      [provider]    Allergies    Aspirin, Latex, Penicillins, and Ampicillin  Review of Systems   Review of Systems  Constitutional: Positive for diaphoresis. Negative for chills, fatigue and fever.  HENT: Negative for congestion.   Eyes: Negative for visual disturbance.  Respiratory: Positive for shortness of breath. Negative for cough, chest tightness and wheezing.   Cardiovascular: Positive for chest pain. Negative for palpitations.  Gastrointestinal: Positive for nausea. Negative for abdominal pain, constipation, diarrhea and vomiting.  Genitourinary: Negative for dysuria, enuresis, flank pain and frequency.  Musculoskeletal: Negative for back pain, neck pain and neck stiffness.  Skin: Negative for rash.  Neurological: Positive for numbness. Negative for weakness, light-headedness and headaches.  Psychiatric/Behavioral: Negative for agitation  and confusion.  All other systems reviewed and are negative.   Physical Exam Updated Vital Signs BP 115/62 (BP Location: Left Arm)   Pulse 96   Temp 99 F (37.2 C) (Oral)   Resp 20   Ht 5\' 6"  (1.676 m)   Wt 106.6 kg   SpO2 98%   BMI 37.93 kg/m   Physical Exam Vitals and nursing note reviewed.  Constitutional:      General: She is not in acute distress.    Appearance: She is well-developed and well-nourished. She is not  ill-appearing, toxic-appearing or diaphoretic.  HENT:     Head: Normocephalic and atraumatic.  Eyes:     Extraocular Movements: Extraocular movements intact.     Conjunctiva/sclera: Conjunctivae normal.     Pupils: Pupils are equal, round, and reactive to light.  Cardiovascular:     Rate and Rhythm: Normal rate and regular rhythm.     Heart sounds: Normal heart sounds. No murmur heard.   Pulmonary:     Effort: Pulmonary effort is normal. No respiratory distress.     Breath sounds: Normal breath sounds. No decreased breath sounds, wheezing, rhonchi or rales.  Abdominal:     Palpations: Abdomen is soft.     Tenderness: There is no abdominal tenderness. There is no guarding or rebound.  Musculoskeletal:        General: No edema.     Cervical back: Neck supple.     Right lower leg: No tenderness. No edema.     Left lower leg: No tenderness. No edema.  Skin:    General: Skin is warm and dry.  Neurological:     General: No focal deficit present.     Mental Status: She is alert.  Psychiatric:        Mood and Affect: Mood normal. Mood is not anxious.     ED Results / Procedures / Treatments   Labs (all labs ordered are listed, but only abnormal results are displayed) Labs Reviewed  COMPREHENSIVE METABOLIC PANEL - Abnormal; Notable for the following components:      Result Value   Potassium 2.5 (*)    Creatinine, Ser 1.09 (*)    GFR, Estimated 55 (*)    All other components within normal limits  D-DIMER, QUANTITATIVE (NOT AT [facility]) - Abnormal; Notable for the following components:   D-Dimer, Quant 0.59 (*)    All other components within normal limits  CBC WITH DIFFERENTIAL/PLATELET  LIPASE, BLOOD  MAGNESIUM  BRAIN NATRIURETIC PEPTIDE  TROPONIN I (HIGH SENSITIVITY)  TROPONIN I (HIGH SENSITIVITY)    EKG EKG Interpretation  Date/Time:  Monday February 21 2020 14:33:54 EST Ventricular Rate:  96 PR Interval:  158 QRS Duration: 88 QT Interval:  416 QTC  Calculation: 525 R Axis:   -9 Text Interpretation: Sinus rhythm with Premature supraventricular complexes and with occasional Premature ventricular complexes and Fusion complexes Low voltage QRS Cannot rule out Inferior infarct , age undetermined Cannot rule out Anterior infarct , age undetermined Prolonged QT Abnormal ECG When compared to prior, similar apperance but longer QTC. No STEMI Confirmed by [id number] (Theda Belfast) on 02/21/2020 3:05:32 PM   Radiology No results found.  Procedures Procedures   EMERGENCY DEPARTMENT  02/23/2020 GUIDANCE EXAM Emergency Ultrasound:  US Guidance for Needle Guidance  INDICATIONS: Difficult vascular access Linear probe used in real-time to visualize location of needle entry through skin.   PERFORMED BY: Myself IMAGES ARCHIVED?: Yes LIMITATIONS: scar tissue VIEWS USED: Transverse INTERPRETATION: Needle visualized within vein,  Left arm and Needle gauge 18  Medications Ordered in ED Medications  nitroGLYCERIN (NITROSTAT) SL tablet 0.4 mg (0.4 mg Sublingual Given 02/21/20 1640)  potassium chloride SA (KLOR-CON) CR tablet 40 mEq (40 mEq Oral Given 02/21/20 1759)  iohexol (OMNIPAQUE) 350 MG/ML injection 100 mL (100 mLs Intravenous Contrast Given 02/21/20 1917)    ED Course  I have reviewed the triage vital signs and the nursing notes.  Pertinent labs & imaging results that were available during my care of the patient were reviewed by me and considered in my medical decision making (see chart for details).    MDM Rules/Calculators/A&P                          Kimberly Gibbs is a 70 y.o. female with a past medical history significant for COPD, asthma, hypertension, and verbal report of prior "heart blockage" in early 2000 who presents with chest pain.  Patient reports that yesterday afternoon, she had onset of chest pressure.  She reports it is in her left chest and radiates down her left arm.  She associated nausea, diaphoresis, shortness of breath,  lightheadedness, and some palpitations.  She reports it feels similar to when she had her heart blockage in the past when the pain was from her heart.  She reports that her discomfort is a 7 out of 10 currently after taking some nitro at home.  She also take aspirin.  She denies recent trauma.  She denies fevers, chills congestion, or cough.  She reports no constipation, diarrhea, or urinary changes.  She reports chronic leg pains with no difference in previous leg pain or leg swelling.  She reports that she has been cared for at [facility] in the past and had her cardiac management at [facility] but she is not interested in going there again.  On exam, lungs are clear and chest is nontender, I cannot reduce her pain.  No murmur.  Good pulses in extremities.  Legs had no significant edema or tenderness.  Abdomen was nontender.  Exam otherwise unremarkable.  EKG shows no STEMI.  Clinically I am concerned as the patient is telling me that her pain is feeling the same and worse than her previous "heart blockage".  Patient reports he already took 325 aspirin today.  We will get screening labs and chest x-ray.  Anticipate a shared decision-making conversation about management.  As she is reporting it feels similar to prior cardiac chest pain, anticipate we will recommend discussions with cardiology and possible admission however it is the patient's birthday and she reports that she is not going to be admitted today.  4:28 PM I placed an ultrasound IV for access and blood work.  Diagnostic work-up again to return.  Patient's potassium is very low at 2.5.  She has been low in the 2.8 range in the past.  Patient is refusing IV potassium but will take oral potassium.  She also had elevated D-dimer.  Due to the discomfort, we will get a delta troponin and order a PE study.  If this is all negative, anticipate having a shared decision-making conversation about management and plan.  Patient's  CT scan showed no evidence of pulmonary embolism.  She did have hypokalemia at 2.5 as we discussed.  Patient did not want IV potassium and was given oral potassium.  Patient was offered admission for hypokalemia and her chest pain which was concerning for a cardiac cause.  Her  D-dimer was negative x2.  She does not want admission.  She would like to be discharged and understands return precautions and follow-up instructions.  She no other questions or concerns and was discharged   Final Clinical Impression(s) / ED Diagnoses Final diagnoses:  Precordial pain  Hypokalemia    Rx / DC Orders ED Discharge Orders         Ordered    potassium chloride SA (KLOR-CON) 20 MEQ tablet  2 times daily        02/21/20 2146          Clinical Impression: 1. Precordial pain   2. Hypokalemia     Disposition: Discharge  Condition: Good  I have discussed the results, Dx and Tx plan with the pt(& family if present). He/she/they expressed understanding and agree(s) with the plan. Discharge instructions discussed at great length. Strict return precautions discussed and pt &/or family have verbalized understanding of the instructions. No further questions at time of discharge.    New Prescriptions   POTASSIUM CHLORIDE SA (KLOR-CON) 20 MEQ TABLET    Take 2 tablets (40 mEq total) by mouth 2 (two) times daily.    Follow Up: Ronnald Collumuran, Michael R, PA-C [facility]  [address] [place] [id number] [phone]     [facility] [address] [id number] [id number] [address] [id number] [phone]       Duvid Smalls, Canary Brimhristopher J, MD 02/21/20 2149

## 2020-02-21 NOTE — ED Triage Notes (Signed)
Chest pain onset yesterday,  Nausea, left arm some numbness

## 2020-02-21 NOTE — ED Notes (Signed)
ED Provider at bedside. 

## 2020-02-21 NOTE — ED Notes (Signed)
Patient transported to X-ray 

## 2020-02-21 NOTE — ED Notes (Signed)
Pt given sublingual nitro reports some relief from pain

## 2020-03-02 ENCOUNTER — Emergency Department (HOSPITAL_COMMUNITY)
Admission: EM | Admit: 2020-03-02 | Discharge: 2020-03-02 | Disposition: A | Discharge status: Home / Self Care | Payer: Medicare Other | Attending: Emergency Medicine | Admitting: Emergency Medicine

## 2020-03-02 ENCOUNTER — Encounter (HOSPITAL_COMMUNITY): Payer: Self-pay | Admitting: Emergency Medicine

## 2020-03-02 ENCOUNTER — Emergency Department (HOSPITAL_COMMUNITY): Payer: Medicare Other

## 2020-03-02 DIAGNOSIS — R630 Anorexia: Secondary | ICD-10-CM | POA: Diagnosis not present

## 2020-03-02 DIAGNOSIS — J45909 Unspecified asthma, uncomplicated: Secondary | ICD-10-CM | POA: Diagnosis not present

## 2020-03-02 DIAGNOSIS — R5383 Other fatigue: Secondary | ICD-10-CM | POA: Diagnosis not present

## 2020-03-02 DIAGNOSIS — Z79899 Other long term (current) drug therapy: Secondary | ICD-10-CM | POA: Insufficient documentation

## 2020-03-02 DIAGNOSIS — R519 Headache, unspecified: Secondary | ICD-10-CM | POA: Insufficient documentation

## 2020-03-02 DIAGNOSIS — R55 Syncope and collapse: Secondary | ICD-10-CM | POA: Insufficient documentation

## 2020-03-02 DIAGNOSIS — M542 Cervicalgia: Secondary | ICD-10-CM | POA: Diagnosis not present

## 2020-03-02 DIAGNOSIS — J449 Chronic obstructive pulmonary disease, unspecified: Secondary | ICD-10-CM | POA: Diagnosis not present

## 2020-03-02 DIAGNOSIS — I10 Essential (primary) hypertension: Secondary | ICD-10-CM | POA: Diagnosis not present

## 2020-03-02 DIAGNOSIS — F419 Anxiety disorder, unspecified: Secondary | ICD-10-CM | POA: Insufficient documentation

## 2020-03-02 DIAGNOSIS — F17210 Nicotine dependence, cigarettes, uncomplicated: Secondary | ICD-10-CM | POA: Insufficient documentation

## 2020-03-02 DIAGNOSIS — Z91040 Latex allergy status: Secondary | ICD-10-CM | POA: Diagnosis not present

## 2020-03-02 LAB — BASIC METABOLIC PANEL
Anion gap: 12 (ref 5–15)
BUN: 15 mg/dL (ref 8–23)
CO2: 27 mmol/L (ref 22–32)
Calcium: 9.3 mg/dL (ref 8.9–10.3)
Chloride: 99 mmol/L (ref 98–111)
Creatinine, Ser: 0.95 mg/dL (ref 0.44–1.00)
GFR, Estimated: >60 mL/min (ref >60)
Glucose, Bld: 111 mg/dL — ABNORMAL HIGH (ref 70–99)
Potassium: 3.1 mmol/L — ABNORMAL LOW (ref 3.5–5.1)
Sodium: 138 mmol/L (ref 135–145)

## 2020-03-02 LAB — CBC
HCT: 39.4 % (ref 36.0–46.0)
Hemoglobin: 13.1 g/dL (ref 12.0–15.0)
MCH: 30.8 pg (ref 26.0–34.0)
MCHC: 33.2 g/dL (ref 30.0–36.0)
MCV: 92.5 fL (ref 80.0–100.0)
Platelets: 219 10*3/uL (ref 150–400)
RBC: 4.26 MIL/uL (ref 3.87–5.11)
RDW: 13.3 % (ref 11.5–15.5)
WBC: 4.5 10*3/uL (ref 4.0–10.5)
nRBC: 0 % (ref 0.0–0.2)

## 2020-03-02 LAB — TROPONIN I (HIGH SENSITIVITY)
Troponin I (High Sensitivity): 5 ng/L (ref <18)
Troponin I (High Sensitivity): 8 ng/L (ref <18)

## 2020-03-02 NOTE — ED Provider Notes (Signed)
[facility] EMERGENCY DEPARTMENT Provider Note   CSN: [id number] Arrival date & time: 03/02/20  1456     History No chief complaint on file.   Kimberly Gibbs is a 70 y.o. female.  HPI   70 year old female with past medical history of HTN, COPD, hypokalemia currently taking potassium supplements presents to the emergency department with a possible syncopal event.  Patient states when she gets really stressed or anxious she sometimes has tunnel vision and feels weak.  She usually has to sit down to get the subsided.  Today she was walking into the court house for an undisclosed reason, states that she was very anxious and upset, she started to get the tunnel vision when she reached the door.  She had noticed that her self down and it is reported that the patient passed out and fell backwards.  Witnesses report that the patient caught herself with her upper arms but then laid flat.  No seizure-like activity.  Patient denies any preceding chest pain, palpitations or shortness of breath.  She states she feels generally weak and fatigued but denies any specific lightheadedness/dizziness.  Denies any neuro complaints.  She is had a decreased appetite since yesterday but otherwise no acute illness, fever, vomiting or diarrhea.  Has been tolerating oral medications as prescribed.  Currently she feels baseline with no active complaints.  Past Medical History:  Diagnosis Date  . Asthma   . COPD (chronic obstructive pulmonary disease) (HCC)   . Hypertension     There are no problems to display for this patient.   Past Surgical History:  Procedure Laterality Date  . FOOT SURGERY       OB History   No obstetric history on file.     No family history on file.  Social History   Tobacco Use  . Smoking status: Current Every Day Smoker    Packs/day: 0.50    Types: Cigarettes  . Smokeless tobacco: Never Used  Substance Use Topics  . Alcohol use: Yes    Comment: rarely   . Drug use: No    Home Medications Prior to Admission medications   Medication Sig Start Date End Date Taking? Authorizing Provider  BREO ELLIPTA 200-25 MCG/INH AEPB Inhale 1 puff into the lungs daily. 12/08/19   [provider]  esomeprazole (NEXIUM) 40 MG capsule Take 40 mg by mouth daily.      [provider]  gabapentin (NEURONTIN) 300 MG capsule Take 300 mg by mouth 3 (three) times daily. 01/11/20   [provider]  Ipratropium-Albuterol (COMBIVENT) 20-100 MCG/ACT AERS respimat Inhale 1 puff into the lungs every 6 (six) hours as needed for wheezing or shortness of breath. For shortness of breath    [provider]  losartan-hydrochlorothiazide (HYZAAR) 100-25 MG tablet Take 1 tablet by mouth daily. 02/03/20   [provider]  meloxicam (MOBIC) 15 MG tablet Take 15 mg by mouth daily. 01/11/20   [provider]  montelukast (SINGULAIR) 10 MG tablet Take 10 mg by mouth daily. 11/05/19   [provider]  nitroGLYCERIN (NITROSTAT) 0.4 MG SL tablet Place 0.4 mg under the tongue every 5 (five) minutes as needed for chest pain. 02/10/20   [provider]  NURTEC 75 MG TBDP Take 1 tablet by mouth daily as needed. 01/11/20   [provider]  oxyCODONE (ROXICODONE) 15 MG immediate release tablet Take 15 mg by mouth 5 (five) times daily. 02/10/20   [provider]  potassium chloride (  KLOR-CON) 10 MEQ tablet Take 1 tablet (10 mEq total) by mouth daily for 7 days. Patient not taking: Reported on 02/21/2020 02/05/19 02/12/19  Tanda Rockers, PA-C  potassium chloride SA (KLOR-CON) 20 MEQ tablet Take 20 mEq by mouth daily.    [provider]  potassium chloride SA (KLOR-CON) 20 MEQ tablet Take 2 tablets (40 mEq total) by mouth 2 (two) times daily. 02/21/20   Tegeler, Canary Brim, MD  SPIRIVA RESPIMAT 1.25 MCG/ACT AERS Inhale 2 puffs into the lungs daily. 12/08/19   [provider]  theophylline (UNIPHYL) 400 MG  24 hr tablet Take 400 mg by mouth 2 (two) times daily. 02/04/20   [provider]  traZODone (DESYREL) 100 MG tablet Take 100 mg by mouth at bedtime. 11/11/19   [provider]  TROKENDI XR 50 MG CP24 Take 1 capsule by mouth daily. 02/10/20   [provider]  verapamil (CALAN-SR) 120 MG CR tablet Take 120 mg by mouth daily. 02/08/20   [provider]  VIBERZI 100 MG TABS Take 1 tablet by mouth 2 (two) times daily. 10/11/19   [provider]    Allergies    Aspirin, Latex, Penicillins, and Ampicillin  Review of Systems   Review of Systems  Constitutional: Positive for fatigue. Negative for chills and fever.  HENT: Negative for congestion.   Eyes: Negative for visual disturbance.  Respiratory: Negative for shortness of breath.   Cardiovascular: Negative for chest pain, palpitations and leg swelling.  Gastrointestinal: Negative for abdominal pain, diarrhea and vomiting.  Genitourinary: Negative for dysuria.  Skin: Negative for rash.  Neurological: Positive for syncope. Negative for seizures, facial asymmetry, numbness and headaches.    Physical Exam Updated Vital Signs BP 110/75 (BP Location: Left Leg)   Pulse 72   Temp 98.2 F (36.8 C) (Oral)   Resp 18   SpO2 96%   Physical Exam Vitals and nursing note reviewed.  Constitutional:      Appearance: Normal appearance.  HENT:     Head: Normocephalic.     Mouth/Throat:     Mouth: Mucous membranes are moist.  Cardiovascular:     Rate and Rhythm: Normal rate.  Pulmonary:     Effort: Pulmonary effort is normal. No respiratory distress.  Abdominal:     Palpations: Abdomen is soft.     Tenderness: There is no abdominal tenderness.  Skin:    General: Skin is warm.  Neurological:     Mental Status: She is alert and oriented to person, place, and time. Mental status is at baseline.  Psychiatric:        Mood and Affect: Mood normal.     ED Results / Procedures / Treatments   Labs (all  labs ordered are listed, but only abnormal results are displayed) Labs Reviewed  BASIC METABOLIC PANEL - Abnormal; Notable for the following components:      Result Value   Potassium 3.1 (*)    Glucose, Bld 111 (*)    All other components within normal limits  CBC  TROPONIN I (HIGH SENSITIVITY)  TROPONIN I (HIGH SENSITIVITY)    EKG EKG Interpretation  Date/Time:  Thursday March 02 2020 15:03:09 EST Ventricular Rate:  79 PR Interval:  170 QRS Duration: 90 QT Interval:  326 QTC Calculation: 373 R Axis:   -11 Text Interpretation: Sinus rhythm with Premature supraventricular complexes and with occasional Premature ventricular complexes ST & T wave abnormality, consider inferior ischemia Abnormal ECG NSR, PVC, no  changes from previous  Confirmed by Coralee Pesa [phone]) on 03/02/2020 5:08:34 PM   Radiology DG Chest 2 View  Result Date: 03/02/2020 CLINICAL DATA:  Dizziness EXAM: CHEST - 2 VIEW COMPARISON:  February 21, 2020 FINDINGS: Cardiomediastinal contours are stable. Hilar structures are unremarkable. No consolidation or sign of pleural effusion. Chronic blunting of the RIGHT costophrenic angle. Lungs are hyperinflated. No acute skeletal process on limited assessment. IMPRESSION: Hyperinflation without acute cardiopulmonary disease. Chronic interstitial prominence without change. Electronically Signed   By: Donzetta Kohut M.D.   On: 03/02/2020 16:18    Procedures Procedures   Medications Ordered in ED Medications - No data to display  ED Course  I have reviewed the triage vital signs and the nursing notes.  Pertinent labs & imaging results that were available during my care of the patient were reviewed by me and considered in my medical decision making (see chart for details).    MDM Rules/Calculators/A&P                          70 year old female presents the emergency department with reported syncope.  Vitals are stable on arrival.  Patient currently is  asymptomatic.  Report is not consistent with a significant head injury, patient denies any headache, posterior head pain, neck pain, neuro symptoms.  She is not on any anticoagulation.  Recommended a head CT given the patients age and possible head injury with amnesia of the even, which the patient adamantly declines.  She understands the risks associated with undiagnosed intracranial pathology however I have a very low suspicion for any acute pathology given her presentation and current exam.   EKG shows normal sinus rhythm with a PVC, no acute changes when compared to previous.  Chest x-ray shows no acute finding.  Patient has mild hypokalemia of 3.1 but this is higher than her previous levels. She is already on supplements, no further adjustment warranted at this time. Blood work is otherwise reassuring.  Troponin is negative.  We are pending repeat troponin and cardiac monitoring.  Patient is requesting to leave the hospital before waiting for her full results or further evaluation.  I do not think there is an acute cardiac or pulmonary cause of her syncope.  This is happened before when she gets "stressed out".  She is under a lot of stress for personal reasons but she does not want to disclose.  I have recommended that she stays in the department with completion of her lab work however she declines.  She is alert and oriented, she has capacity make this decision.  I have not been able to convince her to stay and she understands the risks of leaving AGAINST MEDICAL ADVICE.  Final Clinical Impression(s) / ED Diagnoses Final diagnoses:  None    Rx / DC Orders ED Discharge Orders    None       Rozelle Logan, DO 03/02/20 2000

## 2020-03-02 NOTE — ED Triage Notes (Signed)
[place] from dizziness , pt hit her head is not on blood thinners

## 2020-03-02 NOTE — Discharge Instructions (Signed)
You have chosen to leave the emergency department before lab results and are leaving against medical advice.  Your repeat heart enzyme is still pending, follow-up on my chart for the results.  I have explained to you the need to wait for the second heart enzyme.  You have verbalized understanding of these results and plan and are choosing to leave. You understand the risks associated with leaving without further evaluation/treatment. If you have any worsening symptoms please return immediately to emergency department.

## 2020-04-18 ENCOUNTER — Other Ambulatory Visit: Payer: Self-pay

## 2020-04-18 ENCOUNTER — Ambulatory Visit (HOSPITAL_COMMUNITY)
Admission: EM | Admit: 2020-04-18 | Discharge: 2020-04-18 | Disposition: A | Discharge status: Home / Self Care | Payer: Medicare Other | Attending: Urgent Care | Admitting: Urgent Care

## 2020-04-18 ENCOUNTER — Encounter (HOSPITAL_COMMUNITY): Payer: Self-pay

## 2020-04-18 DIAGNOSIS — R0981 Nasal congestion: Secondary | ICD-10-CM

## 2020-04-18 DIAGNOSIS — R059 Cough, unspecified: Secondary | ICD-10-CM

## 2020-04-18 DIAGNOSIS — J4531 Mild persistent asthma with (acute) exacerbation: Secondary | ICD-10-CM

## 2020-04-18 DIAGNOSIS — R062 Wheezing: Secondary | ICD-10-CM

## 2020-04-18 MED ORDER — LEVOCETIRIZINE DIHYDROCHLORIDE 5 MG PO TABS: 5.0000 mg | ORAL_TABLET | Freq: Every evening | ORAL | 0 refills | Status: AC

## 2020-04-18 MED ORDER — ALBUTEROL SULFATE HFA 108 (90 BASE) MCG/ACT IN AERS
INHALATION_SPRAY | RESPIRATORY_TRACT | Status: AC
  Filled 2020-04-18: qty 6.7

## 2020-04-18 MED ORDER — MONTELUKAST SODIUM 10 MG PO TABS: 10.0000 mg | ORAL_TABLET | Freq: Every day | ORAL | 0 refills | Status: AC

## 2020-04-18 MED ORDER — ALBUTEROL SULFATE (2.5 MG/3ML) 0.083% IN NEBU: 2.5000 mg | INHALATION_SOLUTION | Freq: Four times a day (QID) | RESPIRATORY_TRACT | 0 refills | Status: AC | PRN

## 2020-04-18 MED ORDER — METHYLPREDNISOLONE ACETATE 80 MG/ML IJ SUSP
INTRAMUSCULAR | Status: AC
  Filled 2020-04-18: qty 1

## 2020-04-18 MED ORDER — ALBUTEROL SULFATE HFA 108 (90 BASE) MCG/ACT IN AERS: 1.0000 | INHALATION_SPRAY | Freq: Four times a day (QID) | RESPIRATORY_TRACT | 0 refills | Status: AC | PRN

## 2020-04-18 MED ORDER — METHYLPREDNISOLONE ACETATE 80 MG/ML IJ SUSP
80.0000 mg | Freq: Once | INTRAMUSCULAR | Status: AC
  Administered 2020-04-18: 80 mg via INTRAMUSCULAR

## 2020-04-18 MED ORDER — ALBUTEROL SULFATE HFA 108 (90 BASE) MCG/ACT IN AERS
2.0000 | INHALATION_SPRAY | Freq: Once | RESPIRATORY_TRACT | Status: AC
  Administered 2020-04-18: 2 via RESPIRATORY_TRACT

## 2020-04-18 NOTE — ED Triage Notes (Signed)
Pt presents with congestion, post nasal drainage, and wheezing since waking up this morning.  Pt has asthma

## 2020-04-18 NOTE — ED Provider Notes (Signed)
[facility]   MRN: [id number] DOB: 1950/01/11  Subjective:   Kimberly Gibbs is a 70 y.o. female presenting for acute onset this morning of shortness of breath, wheezing, chest tightness, sinus congestion and postnasal drainage.  Patient has a history of asthma and COPD, takes Combivent and Breo Ellipta.  Does not have an albuterol inhaler.  States that she just got Covid tested yesterday and does not want to be retested.  Denies chest pain, fevers, body aches.  Patient is still like smoker.  No current facility-administered medications for this encounter.  Current Outpatient Medications:  .  BREO ELLIPTA 200-25 MCG/INH AEPB, Inhale 1 puff into the lungs daily., Disp: , Rfl:  .  esomeprazole (NEXIUM) 40 MG capsule, Take 40 mg by mouth daily.  , Disp: , Rfl:  .  gabapentin (NEURONTIN) 300 MG capsule, Take 300 mg by mouth 3 (three) times daily., Disp: , Rfl:  .  Ipratropium-Albuterol (COMBIVENT) 20-100 MCG/ACT AERS respimat, Inhale 1 puff into the lungs every 6 (six) hours as needed for wheezing or shortness of breath. For shortness of breath, Disp: , Rfl:  .  losartan-hydrochlorothiazide (HYZAAR) 100-25 MG tablet, Take 1 tablet by mouth daily., Disp: , Rfl:  .  meloxicam (MOBIC) 15 MG tablet, Take 15 mg by mouth daily., Disp: , Rfl:  .  montelukast (SINGULAIR) 10 MG tablet, Take 10 mg by mouth daily., Disp: , Rfl:  .  nitroGLYCERIN (NITROSTAT) 0.4 MG SL tablet, Place 0.4 mg under the tongue every 5 (five) minutes as needed for chest pain., Disp: , Rfl:  .  NURTEC 75 MG TBDP, Take 1 tablet by mouth daily as needed., Disp: , Rfl:  .  oxyCODONE (ROXICODONE) 15 MG immediate release tablet, Take 15 mg by mouth 5 (five) times daily., Disp: , Rfl:  .  potassium chloride (KLOR-CON) 10 MEQ tablet, Take 1 tablet (10 mEq total) by mouth daily for 7 days. (Patient not taking: Reported on 02/21/2020), Disp: 7 tablet, Rfl: 0 .  potassium chloride SA (KLOR-CON) 20 MEQ tablet, Take 20 mEq by  mouth daily., Disp: , Rfl:  .  potassium chloride SA (KLOR-CON) 20 MEQ tablet, Take 2 tablets (40 mEq total) by mouth 2 (two) times daily., Disp: 20 tablet, Rfl: 0 .  SPIRIVA RESPIMAT 1.25 MCG/ACT AERS, Inhale 2 puffs into the lungs daily., Disp: , Rfl:  .  theophylline (UNIPHYL) 400 MG 24 hr tablet, Take 400 mg by mouth 2 (two) times daily., Disp: , Rfl:  .  traZODone (DESYREL) 100 MG tablet, Take 100 mg by mouth at bedtime., Disp: , Rfl:  .  TROKENDI XR 50 MG CP24, Take 1 capsule by mouth daily., Disp: , Rfl:  .  verapamil (CALAN-SR) 120 MG CR tablet, Take 120 mg by mouth daily., Disp: , Rfl:  .  VIBERZI 100 MG TABS, Take 1 tablet by mouth 2 (two) times daily., Disp: , Rfl:    Allergies  Allergen Reactions  . Aspirin Hives  . Latex Hives  . Penicillins Rash and Hives  . Ampicillin     Past Medical History:  Diagnosis Date  . Asthma   . COPD (chronic obstructive pulmonary disease) (HCC)   . Hypertension      Past Surgical History:  Procedure Laterality Date  . FOOT SURGERY      No family history on file.  Social History   Tobacco Use  . Smoking status: Current Every Day Smoker    Packs/day: 0.50  Types: Cigarettes  . Smokeless tobacco: Never Used  Substance Use Topics  . Alcohol use: Yes    Comment: rarely  . Drug use: No    ROS   Objective:   Vitals: BP 131/85 (BP Location: Right Arm)   Pulse (!) 101   Temp 99.3 F (37.4 C) (Oral)   Resp (!) 22   SpO2 93%   Pulse was 97bpm, pulse oximetry 95% on recheck.   Physical Exam Constitutional:      General: She is not in acute distress.    Appearance: Normal appearance. She is well-developed. She is not ill-appearing, toxic-appearing or diaphoretic.  HENT:     Head: Normocephalic and atraumatic.     Nose: Nose normal.     Mouth/Throat:     Mouth: Mucous membranes are moist.  Eyes:     Extraocular Movements: Extraocular movements intact.     Pupils: Pupils are equal, round, and reactive to light.   Cardiovascular:     Rate and Rhythm: Normal rate and regular rhythm.     Pulses: Normal pulses.     Heart sounds: Normal heart sounds. No murmur heard. No friction rub. No gallop.   Pulmonary:     Effort: Pulmonary effort is normal. No respiratory distress.     Breath sounds: No stridor. Wheezing (diffuse throughout) present. No rhonchi or rales.  Skin:    General: Skin is warm and dry.     Findings: No rash.  Neurological:     Mental Status: She is alert and oriented to person, place, and time.  Psychiatric:        Mood and Affect: Mood normal.        Behavior: Behavior normal.        Thought Content: Thought content normal.     Wheezing dramatically improved after 2 puffs of her albuterol inhaler.  Assessment and Plan :   PDMP not reviewed this encounter.  1. Mild persistent asthma with exacerbation   2. Wheezing   3. Cough   4. Sinus congestion     IM Depo-Medrol in clinic for an asthma exacerbation, recommend that she schedule her nebulized albuterol.  Start Singulair and Xyzal for her concurrent allergic rhinitis.  Follow-up with her regular care doctor soon as possible.  Deferred Covid testing as she just had a negative test through her work yesterday. Counseled patient on potential for adverse effects with medications prescribed/recommended today, ER and return-to-clinic precautions discussed, patient verbalized understanding.    Wallis Bamberg, [place] 04/18/20 1953

## 2020-06-21 ENCOUNTER — Ambulatory Visit (INDEPENDENT_AMBULATORY_CARE_PROVIDER_SITE_OTHER): Payer: Medicaid Other

## 2020-06-21 ENCOUNTER — Ambulatory Visit (HOSPITAL_COMMUNITY): Payer: Medicaid Other

## 2020-06-21 ENCOUNTER — Encounter (HOSPITAL_COMMUNITY): Payer: Self-pay | Admitting: *Deleted

## 2020-06-21 ENCOUNTER — Ambulatory Visit (HOSPITAL_COMMUNITY)
Admission: EM | Admit: 2020-06-21 | Discharge: 2020-06-21 | Disposition: A | Discharge status: Home / Self Care | Payer: Medicaid Other | Attending: Urgent Care | Admitting: Urgent Care

## 2020-06-21 ENCOUNTER — Other Ambulatory Visit: Payer: Self-pay

## 2020-06-21 DIAGNOSIS — J449 Chronic obstructive pulmonary disease, unspecified: Secondary | ICD-10-CM

## 2020-06-21 DIAGNOSIS — R0602 Shortness of breath: Secondary | ICD-10-CM

## 2020-06-21 DIAGNOSIS — W2209XA Striking against other stationary object, initial encounter: Secondary | ICD-10-CM

## 2020-06-21 DIAGNOSIS — M79604 Pain in right leg: Secondary | ICD-10-CM | POA: Diagnosis not present

## 2020-06-21 DIAGNOSIS — R059 Cough, unspecified: Secondary | ICD-10-CM | POA: Diagnosis not present

## 2020-06-21 DIAGNOSIS — S86911A Strain of unspecified muscle(s) and tendon(s) at lower leg level, right leg, initial encounter: Secondary | ICD-10-CM

## 2020-06-21 DIAGNOSIS — M25561 Pain in right knee: Secondary | ICD-10-CM

## 2020-06-21 DIAGNOSIS — J4541 Moderate persistent asthma with (acute) exacerbation: Secondary | ICD-10-CM | POA: Diagnosis not present

## 2020-06-21 MED ORDER — PREDNISONE 50 MG PO TABS: 50.0000 mg | ORAL_TABLET | Freq: Every day | ORAL | 0 refills | Status: AC

## 2020-06-21 NOTE — ED Triage Notes (Signed)
Pt reports SHOB ,Rt leg pain and swelling to Leg.

## 2020-06-21 NOTE — ED Provider Notes (Signed)
[facility]   MRN: [id number] DOB: Jan 25, 1950  Subjective:   Kimberly Gibbs is a 70 y.o. female presenting for several day history of acute onset of recurrent shortness of breath.  Has also had some wheezing.  Admits that anytime there is a weather change she gets these particular symptoms because of her asthma and COPD.  She is using her Breo Ellipta, albuterol without much difference in her symptoms.  She also wants to be evaluated for right knee pain and swelling.  States that she injured it while coming off her bed.  Patient states that she tried to take a step with her right leg and ended up stepping on something that rolled out from under her extending her leg out while she was trying to bear weight on.  Has had swelling and pain that is radiating down her leg.  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  She has an orthopedist but has not been to them in a while.  No current facility-administered medications for this encounter.  Current Outpatient Medications:    albuterol (PROVENTIL) (2.5 MG/3ML) 0.083% nebulizer solution, Take 3 mLs (2.5 mg total) by nebulization every 6 (six) hours as needed for wheezing or shortness of breath., Disp: 75 mL, Rfl: 0   albuterol (VENTOLIN HFA) 108 (90 Base) MCG/ACT inhaler, Inhale 1-2 puffs into the lungs every 6 (six) hours as needed for wheezing or shortness of breath., Disp: 18 g, Rfl: 0   BREO ELLIPTA 200-25 MCG/INH AEPB, Inhale 1 puff into the lungs daily., Disp: , Rfl:    esomeprazole (NEXIUM) 40 MG capsule, Take 40 mg by mouth daily.  , Disp: , Rfl:    gabapentin (NEURONTIN) 300 MG capsule, Take 300 mg by mouth 3 (three) times daily., Disp: , Rfl:    Ipratropium-Albuterol (COMBIVENT) 20-100 MCG/ACT AERS respimat, Inhale 1 puff into the lungs every 6 (six) hours as needed for wheezing or shortness of breath. For shortness of breath, Disp: , Rfl:    levocetirizine (XYZAL) 5 MG tablet, Take 1 tablet  (5 mg total) by mouth every evening., Disp: 90 tablet, Rfl: 0   losartan-hydrochlorothiazide (HYZAAR) 100-25 MG tablet, Take 1 tablet by mouth daily., Disp: , Rfl:    meloxicam (MOBIC) 15 MG tablet, Take 15 mg by mouth daily., Disp: , Rfl:    montelukast (SINGULAIR) 10 MG tablet, Take 1 tablet (10 mg total) by mouth at bedtime., Disp: 90 tablet, Rfl: 0   nitroGLYCERIN (NITROSTAT) 0.4 MG SL tablet, Place 0.4 mg under the tongue every 5 (five) minutes as needed for chest pain., Disp: , Rfl:    NURTEC 75 MG TBDP, Take 1 tablet by mouth daily as needed., Disp: , Rfl:    oxyCODONE (ROXICODONE) 15 MG immediate release tablet, Take 15 mg by mouth 5 (five) times daily., Disp: , Rfl:    potassium chloride (KLOR-CON) 10 MEQ tablet, Take 1 tablet (10 mEq total) by mouth daily for 7 days. (Patient not taking: Reported on 02/21/2020), Disp: 7 tablet, Rfl: 0   potassium chloride SA (KLOR-CON) 20 MEQ tablet, Take 20 mEq by mouth daily., Disp: , Rfl:    potassium chloride SA (KLOR-CON) 20 MEQ tablet, Take 2 tablets (40 mEq total) by mouth 2 (two) times daily., Disp: 20 tablet, Rfl: 0   SPIRIVA RESPIMAT 1.25 MCG/ACT AERS, Inhale 2 puffs into the lungs daily., Disp: , Rfl:    theophylline (UNIPHYL) 400 MG 24 hr tablet, Take 400 mg  by mouth 2 (two) times daily., Disp: , Rfl:    traZODone (DESYREL) 100 MG tablet, Take 100 mg by mouth at bedtime., Disp: , Rfl:    TROKENDI XR 50 MG CP24, Take 1 capsule by mouth daily., Disp: , Rfl:    verapamil (CALAN-SR) 120 MG CR tablet, Take 120 mg by mouth daily., Disp: , Rfl:    VIBERZI 100 MG TABS, Take 1 tablet by mouth 2 (two) times daily., Disp: , Rfl:    Allergies  Allergen Reactions   Aspirin Hives   Latex Hives   Penicillins Rash and Hives   Ampicillin     Past Medical History:  Diagnosis Date   Asthma    COPD (chronic obstructive pulmonary disease) (HCC)    Hypertension      Past Surgical History:  Procedure Laterality Date   FOOT SURGERY      Family  History  Family history unknown: Yes    Social History   Tobacco Use   Smoking status: Every Day    Packs/day: 0.50    Pack years: 0.00    Types: Cigarettes   Smokeless tobacco: Never  Substance Use Topics   Alcohol use: Yes    Comment: rarely   Drug use: No    ROS   Objective:   Vitals: BP 107/70   Pulse 78   Temp 99.4 F (37.4 C)   Resp 20   SpO2 95%   Physical Exam Constitutional:      General: She is not in acute distress.    Appearance: Normal appearance. She is well-developed. She is not ill-appearing, toxic-appearing or diaphoretic.  HENT:     Head: Normocephalic and atraumatic.     Nose: Nose normal.     Mouth/Throat:     Mouth: Mucous membranes are moist.     Pharynx: Oropharynx is clear.  Eyes:     General: No scleral icterus.    Extraocular Movements: Extraocular movements intact.     Pupils: Pupils are equal, round, and reactive to light.  Cardiovascular:     Rate and Rhythm: Normal rate and regular rhythm.     Pulses: Normal pulses.     Heart sounds: Normal heart sounds. No murmur heard.   No friction rub. No gallop.  Pulmonary:     Effort: Pulmonary effort is normal. No respiratory distress.     Breath sounds: No stridor. Wheezing and rhonchi present. No rales.  Musculoskeletal:     Right knee: Swelling, deformity, erythema, ecchymosis and bony tenderness present. No lacerations or crepitus. Decreased range of motion. Tenderness present over the medial joint line. No lateral joint line or patellar tendon tenderness. Normal alignment and normal patellar mobility.       Legs:     Comments: Swelling extends into the lower extremity from the point outlined down into her foot at a level 1+.  Skin:    General: Skin is warm and dry.     Findings: No rash.  Neurological:     General: No focal deficit present.     Mental Status: She is alert and oriented to person, place, and time.  Psychiatric:        Mood and Affect: Mood normal.         Behavior: Behavior normal.        Thought Content: Thought content normal.    DG Chest 2 View  Result Date: 06/21/2020 CLINICAL DATA:  Cough.  Shortness of breath.  History of COPD. EXAM: CHEST -  2 VIEW COMPARISON:  Radiographs 03/02/2020.  CT 02/21/2020. FINDINGS: The heart size and mediastinal contours are stable. There is stable chronic lung disease with hyperinflation and blunting of the right costophrenic angle. No significant pleural effusion, airspace disease, edema or pneumothorax. The bones appear unremarkable. IMPRESSION: Stable changes of chronic obstructive pulmonary disease. No acute cardiopulmonary process. Electronically Signed   By: Carey Bullocks M.D.   On: 06/21/2020 18:57   DG Knee Complete 4 Views Right  Result Date: 06/21/2020 CLINICAL DATA:  Right knee pain after striking knee on bed post. EXAM: RIGHT KNEE - COMPLETE 4+ VIEW COMPARISON:  Office radiographs 10/26/2019 without report. FINDINGS: The bones appear mildly demineralized. No evidence of acute fracture or dislocation. There are mild tricompartmental degenerative changes which are similar to the prior radiographs. There is a small knee joint effusion which is similar to the prior radiographs. There is new medial soft tissue swelling without foreign body. IMPRESSION: Medial soft tissue swelling suggesting soft tissue injury. Stable small joint effusion and tricompartmental degenerative changes. No acute osseous findings. Electronically Signed   By: Carey Bullocks M.D.   On: 06/21/2020 18:59     Assessment and Plan :   PDMP not reviewed this encounter.  1. Moderate persistent asthma with exacerbation   2. Right leg pain   3. Acute pain of right knee   4. Shortness of breath   5. Chronic obstructive pulmonary disease, unspecified COPD type (HCC)   6. Knee strain, right, initial encounter     We will use an oral prednisone course to help with her shortness of breath, wheezing in the setting of asthma and COPD.   Recommended continued use of inhalers.  Patient declined a COVID test.  I expect the prednisone course will help with her knee swelling and pain as well.  Follow-up with Ortho soon as possible. Counseled patient on potential for adverse effects with medications prescribed/recommended today, ER and return-to-clinic precautions discussed, patient verbalized understanding.    Wallis Bamberg, [place] 06/21/20 1934

## 2021-03-12 ENCOUNTER — Other Ambulatory Visit (HOSPITAL_COMMUNITY): Payer: Self-pay | Admitting: *Deleted

## 2021-03-12 DIAGNOSIS — R079 Chest pain, unspecified: Secondary | ICD-10-CM

## 2021-03-21 ENCOUNTER — Encounter (HOSPITAL_COMMUNITY): Payer: Self-pay

## 2021-03-21 ENCOUNTER — Other Ambulatory Visit: Payer: Self-pay

## 2021-03-21 ENCOUNTER — Ambulatory Visit (HOSPITAL_COMMUNITY)
Admission: EM | Admit: 2021-03-21 | Discharge: 2021-03-21 | Disposition: A | Discharge status: Home / Self Care | Payer: Medicare Other | Attending: Nurse Practitioner | Admitting: Nurse Practitioner

## 2021-03-21 DIAGNOSIS — K047 Periapical abscess without sinus: Secondary | ICD-10-CM

## 2021-03-21 MED ORDER — CLINDAMYCIN HCL 150 MG PO CAPS: 450.0000 mg | ORAL_CAPSULE | Freq: Three times a day (TID) | ORAL | 0 refills | Status: AC

## 2021-03-21 NOTE — ED Triage Notes (Signed)
Pt presents with c/o oral swelling. States the side of her tongue and under neath her tongue are swollen X 1 day.  ?

## 2021-03-21 NOTE — Discharge Instructions (Signed)
-   Please start the antibiotics for the tooth pain. ?- Please reach out to a dentist and make an appointment to evaluate your teeth. ?- Follow up with PCP if symptoms persist despite treatment. ?- If symptoms worsen, go to the ER. ?

## 2021-03-21 NOTE — ED Provider Notes (Signed)
?[facility] ? ? ? ?CSN: [id number] ?Arrival date & time: 03/21/21  1939 ? ? ?  ? ?History   ?Chief Complaint ?Chief Complaint  ?Patient presents with  ? Oral Swelling  ? ? ?HPI ?Kimberly Gibbs is a 71 y.o. female.  ? ?Patient presents with 1 day history of right tooth and tongue pain.  She denies fevers, drainage inside her mouth, nausea, vomiting, diarrhea.  She has been unable to eat today because of the pain.  She reports the right side of her face is swollen.  She reports she has not been seen by a dentist in a number of years.  ? ? ?Past Medical History:  ?Diagnosis Date  ? Asthma   ? COPD (chronic obstructive pulmonary disease) (HCC)   ? Hypertension   ? ? ?There are no problems to display for this patient. ? ? ?Past Surgical History:  ?Procedure Laterality Date  ? FOOT SURGERY    ? ? ?OB History   ?No obstetric history on file. ?  ? ? ? ?Home Medications   ? ?Prior to Admission medications   ?Medication Sig Start Date End Date Taking? Authorizing Provider  ?clindamycin (CLEOCIN) 150 MG capsule Take 3 capsules (450 mg total) by mouth every 8 (eight) hours for 7 days. 03/21/21 03/28/21 Yes Valentino NoseMartinez, Cade Dashner A, NP  ?albuterol (PROVENTIL) (2.5 MG/3ML) 0.083% nebulizer solution Take 3 mLs (2.5 mg total) by nebulization every 6 (six) hours as needed for wheezing or shortness of breath. 04/18/20   Wallis BambergMani, Mario, PA-C  ?albuterol (VENTOLIN HFA) 108 (90 Base) MCG/ACT inhaler Inhale 1-2 puffs into the lungs every 6 (six) hours as needed for wheezing or shortness of breath. 04/18/20   Wallis BambergMani, Mario, PA-C  ?BREO ELLIPTA 200-25 MCG/INH AEPB Inhale 1 puff into the lungs daily. 12/08/19   [provider]  ?esomeprazole (NEXIUM) 40 MG capsule Take 40 mg by mouth daily.      [provider]  ?gabapentin (NEURONTIN) 300 MG capsule Take 300 mg by mouth 3 (three) times daily. 01/11/20   [provider]  ?Ipratropium-Albuterol (COMBIVENT) 20-100 MCG/ACT AERS respimat Inhale 1 puff into the lungs every 6  (six) hours as needed for wheezing or shortness of breath. For shortness of breath    [provider]  ?levocetirizine (XYZAL) 5 MG tablet Take 1 tablet (5 mg total) by mouth every evening. 04/18/20   Wallis BambergMani, Mario, PA-C  ?losartan-hydrochlorothiazide (HYZAAR) 100-25 MG tablet Take 1 tablet by mouth daily. 02/03/20   [provider]  ?meloxicam (MOBIC) 15 MG tablet Take 15 mg by mouth daily. 01/11/20   [provider]  ?montelukast (SINGULAIR) 10 MG tablet Take 1 tablet (10 mg total) by mouth at bedtime. 04/18/20   Wallis BambergMani, Mario, PA-C  ?nitroGLYCERIN (NITROSTAT) 0.4 MG SL tablet Place 0.4 mg under the tongue every 5 (five) minutes as needed for chest pain. 02/10/20   [provider]  ?NURTEC 75 MG TBDP Take 1 tablet by mouth daily as needed. 01/11/20   [provider]  ?oxyCODONE (ROXICODONE) 15 MG immediate release tablet Take 15 mg by mouth 5 (five) times daily. 02/10/20   [provider]  ?potassium chloride (KLOR-CON) 10 MEQ tablet Take 1 tablet (10 mEq total) by mouth daily for 7 days. ?Patient not taking: Reported on 02/21/2020 02/05/19 02/12/19  Tanda RockersVenter, Margaux, PA-C  ?potassium chloride SA (KLOR-CON) 20 MEQ tablet Take 20 mEq by mouth daily.    [provider]  ?potassium chloride SA (KLOR-CON) 20 MEQ tablet  Take 2 tablets (40 mEq total) by mouth 2 (two) times daily. 02/21/20   Tegeler, Canary Brim, MD  ?predniSONE (DELTASONE) 50 MG tablet Take 1 tablet (50 mg total) by mouth daily with breakfast. 06/21/20   Wallis Bamberg, PA-C  ?SPIRIVA RESPIMAT 1.25 MCG/ACT AERS Inhale 2 puffs into the lungs daily. 12/08/19   [provider]  ?theophylline (UNIPHYL) 400 MG 24 hr tablet Take 400 mg by mouth 2 (two) times daily. 02/04/20   [provider]  ?traZODone (DESYREL) 100 MG tablet Take 100 mg by mouth at bedtime. 11/11/19   [provider]  ?TROKENDI XR 50 MG CP24 Take 1 capsule by mouth daily. 02/10/20   [provider]  ?verapamil (CALAN-SR)  120 MG CR tablet Take 120 mg by mouth daily. 02/08/20   [provider]  ?VIBERZI 100 MG TABS Take 1 tablet by mouth 2 (two) times daily. 10/11/19   [provider]  ? ? ?Family History ?Family History  ?Family history unknown: Yes  ? ? ?Social History ?Social History  ? ?Tobacco Use  ? Smoking status: Every Day  ?  Packs/day: 0.50  ?  Types: Cigarettes  ? Smokeless tobacco: Never  ?Substance Use Topics  ? Alcohol use: Yes  ?  Comment: rarely  ? Drug use: No  ? ? ? ?Allergies   ?Aspirin, Latex, Penicillins, and Ampicillin ? ? ?Review of Systems ?Review of Systems ?Per HPI ? ?Physical Exam ?Triage Vital Signs ?ED Triage Vitals  ?Enc Vitals Group  ?   BP 03/21/21 2006 117/83  ?   Pulse Rate 03/21/21 2006 91  ?   Resp 03/21/21 2006 16  ?   Temp --   ?   Temp src --   ?   SpO2 03/21/21 2006 100 %  ?   Weight --   ?   Height --   ?   Head Circumference --   ?   Peak Flow --   ?   Pain Score 03/21/21 2004 9  ?   Pain Loc --   ?   Pain Edu? --   ?   Excl. in GC? --   ? ?No data found. ? ?Updated Vital Signs ?BP 117/83 (BP Location: Right Arm)   Pulse 91   Resp 16   SpO2 100%  ? ?Visual Acuity ?Right Eye Distance:   ?Left Eye Distance:   ?Bilateral Distance:   ? ?Right Eye Near:   ?Left Eye Near:    ?Bilateral Near:    ? ?Physical Exam ?Vitals and nursing note reviewed.  ?Constitutional:   ?   General: She is not in acute distress. ?   Appearance: Normal appearance. She is not toxic-appearing.  ?HENT:  ?   Head: Normocephalic and atraumatic.  ?   Right Ear: External ear normal.  ?   Left Ear: External ear normal.  ?   Nose: Nose normal. No congestion.  ?   Mouth/Throat:  ?   Mouth: Mucous membranes are dry.  ?   Dentition: Abnormal dentition. Dental tenderness and dental abscesses present.  ?   Tongue: No lesions. Tongue does not deviate from midline.  ?   Palate: No mass and lesions.  ?   Pharynx: Oropharynx is clear. No uvula swelling.  ?   Tonsils: No tonsillar exudate.  ?   Comments: Right external jaw  visibly edematous; no warmth or erythema externally ?Pulmonary:  ?   Effort: Pulmonary effort is normal. No respiratory distress.  ?  Musculoskeletal:  ?   Cervical back: Normal range of motion.  ?Lymphadenopathy:  ?   Cervical: Cervical adenopathy present.  ?Skin: ?   General: Skin is warm and dry.  ?   Coloration: Skin is not jaundiced or pale.  ?   Findings: No erythema.  ?Neurological:  ?   Mental Status: She is alert and oriented to person, place, and time.  ? ? ? ?UC Treatments / Results  ?Labs ?(all labs ordered are listed, but only abnormal results are displayed) ?Labs Reviewed - No data to display ? ?EKG ? ? ?Radiology ?No results found. ? ?Procedures ?Procedures (including critical care time) ? ?Medications Ordered in UC ?Medications - No data to display ? ?Initial Impression / Assessment and Plan / UC Course  ?I have reviewed the triage vital signs and the nursing notes. ? ?Pertinent labs & imaging results that were available during my care of the patient were reviewed by me and considered in my medical decision making (see chart for details). ? ?  ?Treat dental abscess with clindamycin 450 mg three times daily for 7 days.  Encouraged soft foods and liquids until dental pain improves.  Also encouraged close follow up with Dentist and list given for affordable dentists in the area.  If pain does not improve or worsens with use of antibiotics, seek care. ?Final Clinical Impressions(s) / UC Diagnoses  ? ?Final diagnoses:  ?Dental abscess  ? ? ? ?Discharge Instructions   ? ?  ?- Please start the antibiotics for the tooth pain. ?- Please reach out to a dentist and make an appointment to evaluate your teeth. ?- Follow up with PCP if symptoms persist despite treatment. ?- If symptoms worsen, go to the ER. ? ? ? ? ?ED Prescriptions   ? ? Medication Sig Dispense Auth. Provider  ? clindamycin (CLEOCIN) 150 MG capsule Take 3 capsules (450 mg total) by mouth every 8 (eight) hours for 7 days. 63 capsule Valentino Nose, NP  ? ?  ? ?PDMP not reviewed this encounter. ?  ?Valentino Nose, NP ?03/21/21 2049 ? ?

## 2021-03-22 ENCOUNTER — Emergency Department (HOSPITAL_BASED_OUTPATIENT_CLINIC_OR_DEPARTMENT_OTHER): Payer: Medicare Other

## 2021-03-22 ENCOUNTER — Other Ambulatory Visit: Payer: Self-pay

## 2021-03-22 ENCOUNTER — Emergency Department (HOSPITAL_BASED_OUTPATIENT_CLINIC_OR_DEPARTMENT_OTHER)
Admission: EM | Admit: 2021-03-22 | Discharge: 2021-03-22 | Disposition: A | Discharge status: Home / Self Care | Payer: Medicare Other | Attending: Emergency Medicine | Admitting: Emergency Medicine

## 2021-03-22 ENCOUNTER — Encounter (HOSPITAL_BASED_OUTPATIENT_CLINIC_OR_DEPARTMENT_OTHER): Payer: Self-pay | Admitting: Emergency Medicine

## 2021-03-22 DIAGNOSIS — K115 Sialolithiasis: Secondary | ICD-10-CM | POA: Diagnosis not present

## 2021-03-22 DIAGNOSIS — Z79899 Other long term (current) drug therapy: Secondary | ICD-10-CM | POA: Insufficient documentation

## 2021-03-22 DIAGNOSIS — Z91040 Latex allergy status: Secondary | ICD-10-CM | POA: Diagnosis not present

## 2021-03-22 DIAGNOSIS — Z7951 Long term (current) use of inhaled steroids: Secondary | ICD-10-CM | POA: Diagnosis not present

## 2021-03-22 DIAGNOSIS — J449 Chronic obstructive pulmonary disease, unspecified: Secondary | ICD-10-CM | POA: Insufficient documentation

## 2021-03-22 DIAGNOSIS — I10 Essential (primary) hypertension: Secondary | ICD-10-CM | POA: Insufficient documentation

## 2021-03-22 DIAGNOSIS — K0889 Other specified disorders of teeth and supporting structures: Secondary | ICD-10-CM | POA: Diagnosis present

## 2021-03-22 LAB — BASIC METABOLIC PANEL
Anion gap: 10 (ref 5–15)
BUN: 16 mg/dL (ref 8–23)
CO2: 28 mmol/L (ref 22–32)
Calcium: 9.2 mg/dL (ref 8.9–10.3)
Chloride: 99 mmol/L (ref 98–111)
Creatinine, Ser: 0.88 mg/dL (ref 0.44–1.00)
GFR, Estimated: >60 mL/min (ref >60)
Glucose, Bld: 99 mg/dL (ref 70–99)
Potassium: 3.6 mmol/L (ref 3.5–5.1)
Sodium: 137 mmol/L (ref 135–145)

## 2021-03-22 LAB — CBC WITH DIFFERENTIAL/PLATELET
Abs Immature Granulocytes: 0.01 10*3/uL (ref 0.00–0.07)
Basophils Absolute: 0.1 10*3/uL (ref 0.0–0.1)
Basophils Relative: 1 %
Eosinophils Absolute: 0.1 10*3/uL (ref 0.0–0.5)
Eosinophils Relative: 2 %
HCT: 40.8 % (ref 36.0–46.0)
Hemoglobin: 13.5 g/dL (ref 12.0–15.0)
Immature Granulocytes: 0 %
Lymphocytes Relative: 38 %
Lymphs Abs: 2.5 10*3/uL (ref 0.7–4.0)
MCH: 30.1 pg (ref 26.0–34.0)
MCHC: 33.1 g/dL (ref 30.0–36.0)
MCV: 90.9 fL (ref 80.0–100.0)
Monocytes Absolute: 0.5 10*3/uL (ref 0.1–1.0)
Monocytes Relative: 7 %
Neutro Abs: 3.4 10*3/uL (ref 1.7–7.7)
Neutrophils Relative %: 52 %
Platelets: 272 10*3/uL (ref 150–400)
RBC: 4.49 MIL/uL (ref 3.87–5.11)
RDW: 13.2 % (ref 11.5–15.5)
WBC: 6.6 10*3/uL (ref 4.0–10.5)
nRBC: 0 % (ref 0.0–0.2)

## 2021-03-22 LAB — LACTIC ACID, PLASMA: Lactic Acid, Venous: 1 mmol/L (ref 0.5–1.9)

## 2021-03-22 MED ORDER — MORPHINE SULFATE (PF) 4 MG/ML IV SOLN
4.0000 mg | Freq: Once | INTRAVENOUS | Status: AC
  Administered 2021-03-22: 4 mg via INTRAVENOUS
  Filled 2021-03-22: qty 1

## 2021-03-22 MED ORDER — ONDANSETRON HCL 4 MG/2ML IJ SOLN
4.0000 mg | Freq: Once | INTRAMUSCULAR | Status: AC
  Administered 2021-03-22: 4 mg via INTRAVENOUS
  Filled 2021-03-22: qty 2

## 2021-03-22 MED ORDER — IOHEXOL 300 MG/ML  SOLN
100.0000 mL | Freq: Once | INTRAMUSCULAR | Status: AC | PRN
  Administered 2021-03-22: 100 mL via INTRAVENOUS

## 2021-03-22 MED ORDER — HYDROCODONE-ACETAMINOPHEN 5-325 MG PO TABS: 2.0000 | ORAL_TABLET | ORAL | 0 refills | Status: DC | PRN

## 2021-03-22 NOTE — ED Notes (Signed)
Patient transported to CT 

## 2021-03-22 NOTE — ED Provider Notes (Signed)
?[facility] EMERGENCY DEPARTMENT ?Provider Note ? ? ?CSN: [id number] ?Arrival date & time: 03/22/21  1629 ? ?  ? ?History ? ?Chief Complaint  ?Patient presents with  ? Dental Pain  ? ? ?Kimberly Gibbs is a 71 y.o. female who was seen in urgent care yesterday and diagnosed with a right lower jaw dental infection and discharged with clindamycin who presents today for worsening pain.  Patient states she has had pain for the last 3 days though she endorses that the pain has started in her tongue.  She denies any dental pain but states pain has always been in her tongue and she has difficulty swallowing eating secondary to severe worsening pain in her tongue as well as swelling beneath the tongue.  No history of angioedema. ? ?She denies any fevers or chills, swallowing her secretions well.  Says she has taken 3 doses of clindamycin without any improvement in her symptoms. ? ?I personally reviewed her medical records.  She is history of hypertension, COPD.  She is not anticoagulated.  She is on losartan HCTZ. ? ?HPI ? ?  ? ?Home Medications ?Prior to Admission medications   ?Medication Sig Start Date End Date Taking? Authorizing Provider  ?albuterol (PROVENTIL) (2.5 MG/3ML) 0.083% nebulizer solution Take 3 mLs (2.5 mg total) by nebulization every 6 (six) hours as needed for wheezing or shortness of breath. 04/18/20   Wallis Bamberg, PA-C  ?albuterol (VENTOLIN HFA) 108 (90 Base) MCG/ACT inhaler Inhale 1-2 puffs into the lungs every 6 (six) hours as needed for wheezing or shortness of breath. 04/18/20   Wallis Bamberg, PA-C  ?BREO ELLIPTA 200-25 MCG/INH AEPB Inhale 1 puff into the lungs daily. 12/08/19   [provider]  ?clindamycin (CLEOCIN) 150 MG capsule Take 3 capsules (450 mg total) by mouth every 8 (eight) hours for 7 days. 03/21/21 03/28/21  Valentino Nose, NP  ?esomeprazole (NEXIUM) 40 MG capsule Take 40 mg by mouth daily.      [provider]  ?gabapentin (NEURONTIN) 300 MG capsule Take 300  mg by mouth 3 (three) times daily. 01/11/20   [provider]  ?Ipratropium-Albuterol (COMBIVENT) 20-100 MCG/ACT AERS respimat Inhale 1 puff into the lungs every 6 (six) hours as needed for wheezing or shortness of breath. For shortness of breath    [provider]  ?levocetirizine (XYZAL) 5 MG tablet Take 1 tablet (5 mg total) by mouth every evening. 04/18/20   Wallis Bamberg, PA-C  ?losartan-hydrochlorothiazide (HYZAAR) 100-25 MG tablet Take 1 tablet by mouth daily. 02/03/20   [provider]  ?meloxicam (MOBIC) 15 MG tablet Take 15 mg by mouth daily. 01/11/20   [provider]  ?montelukast (SINGULAIR) 10 MG tablet Take 1 tablet (10 mg total) by mouth at bedtime. 04/18/20   Wallis Bamberg, PA-C  ?nitroGLYCERIN (NITROSTAT) 0.4 MG SL tablet Place 0.4 mg under the tongue every 5 (five) minutes as needed for chest pain. 02/10/20   [provider]  ?NURTEC 75 MG TBDP Take 1 tablet by mouth daily as needed. 01/11/20   [provider]  ?oxyCODONE (ROXICODONE) 15 MG immediate release tablet Take 15 mg by mouth 5 (five) times daily. 02/10/20   [provider]  ?potassium chloride (KLOR-CON) 10 MEQ tablet Take 1 tablet (10 mEq total) by mouth daily for 7 days. ?Patient not taking: Reported on 02/21/2020 02/05/19 02/12/19  Tanda Rockers, PA-C  ?potassium chloride SA (KLOR-CON) 20 MEQ tablet Take 20 mEq by mouth daily.    [provider]  ?  potassium chloride SA (KLOR-CON) 20 MEQ tablet Take 2 tablets (40 mEq total) by mouth 2 (two) times daily. 02/21/20   Tegeler, Canary Brim, MD  ?predniSONE (DELTASONE) 50 MG tablet Take 1 tablet (50 mg total) by mouth daily with breakfast. 06/21/20   Wallis Bamberg, PA-C  ?SPIRIVA RESPIMAT 1.25 MCG/ACT AERS Inhale 2 puffs into the lungs daily. 12/08/19   [provider]  ?theophylline (UNIPHYL) 400 MG 24 hr tablet Take 400 mg by mouth 2 (two) times daily. 02/04/20   [provider]  ?traZODone (DESYREL) 100 MG tablet Take 100  mg by mouth at bedtime. 11/11/19   [provider]  ?TROKENDI XR 50 MG CP24 Take 1 capsule by mouth daily. 02/10/20   [provider]  ?verapamil (CALAN-SR) 120 MG CR tablet Take 120 mg by mouth daily. 02/08/20   [provider]  ?VIBERZI 100 MG TABS Take 1 tablet by mouth 2 (two) times daily. 10/11/19   [provider]  ?   ? ?Allergies    ?Aspirin, Latex, Penicillins, and Ampicillin   ? ?Review of Systems   ?Review of Systems  ?Constitutional: Negative.   ?HENT:  Positive for drooling and facial swelling.   ?     Tongue swelling and pain  ?Respiratory: Negative.    ?Cardiovascular: Negative.   ?Gastrointestinal: Negative.   ?Genitourinary: Negative.   ?Neurological: Negative.   ? ?Physical Exam ?Updated Vital Signs ?BP 116/78   Pulse 80   Temp 97.8 ?F (36.6 ?C) (Oral)   Resp 16   SpO2 96%  ?Physical Exam ?Vitals and nursing note reviewed.  ?Constitutional:   ?   Appearance: She is not ill-appearing or toxic-appearing.  ?HENT:  ?   Head: Normocephalic and atraumatic.  ?   Jaw: There is normal jaw occlusion.  ?   Nose: Nose normal.  ?   Mouth/Throat:  ?   Mouth: Mucous membranes are moist.  ?   Tongue: Tongue does not deviate from midline.  ?   Palate: No lesions.  ?   Pharynx: Oropharynx is clear. Uvula midline. No oropharyngeal exudate or posterior oropharyngeal erythema.  ?   Tonsils: No tonsillar exudate.  ? ?   Comments: Significant sublingual edema and exquisite tenderness to palpation without pharyngeal narrowing or uvular or lingual deviation.  Submental tenderness to palpation as well. ?Eyes:  ?   General: Lids are normal. Vision grossly intact.     ?   Right eye: No discharge.     ?   Left eye: No discharge.  ?   Extraocular Movements: Extraocular movements intact.  ?   Conjunctiva/sclera: Conjunctivae normal.  ?   Pupils: Pupils are equal, round, and reactive to light.  ?Neck:  ?   Trachea: Trachea and phonation normal. No tracheal tenderness or tracheostomy.  ?    Meningeal: Brudzinski's sign and Kernig's sign absent.  ? ?Cardiovascular:  ?   Rate and Rhythm: Normal rate and regular rhythm.  ?   Pulses: Normal pulses.  ?   Heart sounds: Normal heart sounds. No murmur heard. ?Pulmonary:  ?   Effort: Pulmonary effort is normal. No tachypnea, bradypnea, accessory muscle usage, prolonged expiration or respiratory distress.  ?   Breath sounds: Normal breath sounds. No wheezing or rales.  ?Chest:  ?   Chest wall: No mass, lacerations, deformity, swelling, tenderness, crepitus or edema.  ?Abdominal:  ?   General: Bowel sounds are normal. There is no distension.  ?   Palpations: Abdomen is  soft.  ?   Tenderness: There is no abdominal tenderness. There is no right CVA tenderness, left CVA tenderness, guarding or rebound.  ?Musculoskeletal:     ?   General: No deformity.  ?   Cervical back: Neck supple. No edema. Pain with movement present. No spinous process tenderness or muscular tenderness.  ?   Right lower leg: No edema.  ?   Left lower leg: No edema.  ?Skin: ?   General: Skin is warm and dry.  ?   Capillary Refill: Capillary refill takes less than 2 seconds.  ?Neurological:  ?   General: No focal deficit present.  ?   Mental Status: She is alert and oriented to person, place, and time. Mental status is at baseline.  ?   Gait: Gait is intact.  ?Psychiatric:     ?   Mood and Affect: Mood normal.  ? ? ?ED Results / Procedures / Treatments   ?Labs ?(all labs ordered are listed, but only abnormal results are displayed) ?Labs Reviewed  ?CBC WITH DIFFERENTIAL/PLATELET  ?BASIC METABOLIC PANEL  ?LACTIC ACID, PLASMA  ?LACTIC ACID, PLASMA  ? ? ?EKG ?None ? ?Radiology ?No results found. ? ?Procedures ?Procedures  ? ? ?Medications Ordered in ED ?Medications  ?morphine (PF) 4 MG/ML injection 4 mg (4 mg Intravenous Given 03/22/21 1847)  ?ondansetron [facility](ZOFRAN) injection 4 mg (4 mg Intravenous Given 03/22/21 1846)  ?iohexol (OMNIPAQUE) 300 MG/ML solution 100 mL (100 mLs Intravenous Contrast Given  03/22/21 1918)  ? ? ?ED Course/ Medical Decision Making/ A&P ?  ?                        ?Medical Decision Making ?71 year old female who presents with concern for tongue pain and swelling. ? ?Vitals in the normal in

## 2021-03-22 NOTE — ED Notes (Signed)
ED Provider at bedside. 

## 2021-03-22 NOTE — Discharge Instructions (Addendum)
You were seen in the emergency department for dental pain. ? ?Your scan showed a 3 mm stone in your salivary gland. I want you to continue your current antibiotic and I am prescribing you a course of pain medication. This is similar to the pain medication you already take but has a tylenol component with it.  ? ?Continue your previously prescribed antibiotic. It's important you finish the entire course to prevent infection.  ? ?You also can apply moist heat to the area.  I recommend sucking on tart/sour candy like lemon drops.  This helps work the gland and bring the stone out. ? ?I have attached the contact information for the ENT.  I'd like you to give them a call and schedule an appointment if your symptoms do not improve within the next week. ? ?Continue to monitor how you're doing and return to the ER for new or worsening symptoms.  ?

## 2021-03-22 NOTE — ED Notes (Signed)
Provider made aware that pt looking for CT result, will discuss with pt and family ?

## 2021-03-22 NOTE — ED Triage Notes (Signed)
Pt c/o oral swelling and pain, more severe on the right lower jaw x 3 days. Pt seen at [facility] yesterday and rx'd abx, which she started last night. Airway patent. Pt maintaining secretions and speaking in full sentences. Pt has appointment on Monday with [facility].  ?

## 2021-03-23 NOTE — ED Provider Notes (Signed)
Patient discussed and care taken over from previous provider Sponseller PA-C at shift change. See her note for full HPI.  ? ?Physical Exam  ?BP 122/80 (BP Location: Right Arm)   Pulse 68   Temp 98.2 ?F (36.8 ?C) (Oral)   Resp 15   SpO2 95%  ? ?Physical Exam ?Vitals and nursing note reviewed.  ?Constitutional:   ?   Appearance: Normal appearance.  ?HENT:  ?   Head: Normocephalic and atraumatic.  ?   Mouth/Throat:  ?   Tongue: No lesions. Tongue does not deviate from midline.  ?   Pharynx: Oropharynx is clear.  ?   Comments: No trismus.  There is sublingual and submental edema and tenderness to palpation. ?Eyes:  ?   Conjunctiva/sclera: Conjunctivae normal.  ?Pulmonary:  ?   Effort: Pulmonary effort is normal. No respiratory distress.  ?Skin: ?   General: Skin is warm and dry.  ?Neurological:  ?   Mental Status: She is alert.  ?Psychiatric:     ?   Mood and Affect: Mood normal.     ?   Behavior: Behavior normal.  ? ?Procedures  ?Procedures ? ?ED Course / MDM  ?  ?Medical Decision Making ?Amount and/or Complexity of Data Reviewed ?Labs: ordered. ?Radiology: ordered. ? ?Risk ?Prescription drug management. ? ?HPI: ?Patient is a 71 year old female who presents emergency department complaining of oral swelling, tongue and right lower jaw pain for 3 days.  She was seen in urgent care yesterday and was diagnosed with a dental infection, was discharged on clindamycin.  States that the pain is gotten significantly worse.  She has an appointment on Monday with [facility], but they recommended she be evaluated in the emergency department. ? ?Physical Exam: ?On my exam patient is afebrile, not tachycardic, no acute distress.  No trismus.  There is sublingual and submental edema and tenderness to palpation. ? ?Labs/Imaging: ?Interpreted labs ordered by previous provider.  CBC without leukocytosis or anemia.  BMP unremarkable.  Lactic acid is normal.  CT soft tissue of the neck showed ductal prominence left  submandibular gland with 3 mm stone consistent with obstructing sialolithiasis. I interpreted with the images and agree with the radiologist interpretation. ? ?Disposition: ?We will treat with pain medication and continue the patient's current antibiotic regimen. Patient given referral to ENT. Will encourage sucking on sour candy to hopefully pull out the stone. As she is clinically well appearing, no fever, and normal lactic acidosis, I have low suspicion for acute infection requiring intervention today. We discussed reasons tor return to the ER, and patient is agreeable to the plan.  ? ?I discussed this case with my attending physician Dr. Wilkie Aye who cosigned this note including patient's presenting symptoms, physical exam, and planned diagnostics and interventions. Attending physician stated agreement with plan or made changes to plan which were implemented.  ?  Su Monks, PA-C ?03/23/21 1355 ? ?  ?Rozelle Logan, DO ?03/26/21 0901 ? ?

## 2021-04-06 ENCOUNTER — Ambulatory Visit (HOSPITAL_COMMUNITY): Payer: Medicare Other

## 2021-04-16 ENCOUNTER — Other Ambulatory Visit (HOSPITAL_BASED_OUTPATIENT_CLINIC_OR_DEPARTMENT_OTHER): Payer: Self-pay

## 2021-04-16 ENCOUNTER — Telehealth (HOSPITAL_COMMUNITY): Payer: Self-pay | Admitting: Emergency Medicine

## 2021-04-16 DIAGNOSIS — R079 Chest pain, unspecified: Secondary | ICD-10-CM

## 2021-04-16 MED ORDER — METOPROLOL TARTRATE 50 MG PO TABS
50.0000 mg | ORAL_TABLET | Freq: Once | ORAL | 0 refills | Status: AC
  Filled 2021-04-16: qty 1, 1d supply, fill #0

## 2021-04-16 NOTE — Telephone Encounter (Signed)
Attempted to call patient regarding upcoming cardiac CT appointment. ?Left message on voicemail with name and callback number ?Rockwell Alexandria RN Navigator Cardiac Imaging ? [facility] Services ?[phone] Office ?[phone] Cell ? ?One time dose 50mg  metoprolol tartrate prescribed to pharm on file.  ? ?

## 2021-04-17 ENCOUNTER — Ambulatory Visit (HOSPITAL_COMMUNITY): Admission: RE | Admit: 2021-04-17 | Payer: Medicare Other | Source: Ambulatory Visit

## 2021-04-23 ENCOUNTER — Other Ambulatory Visit (HOSPITAL_BASED_OUTPATIENT_CLINIC_OR_DEPARTMENT_OTHER): Payer: Self-pay

## 2021-06-21 ENCOUNTER — Other Ambulatory Visit (HOSPITAL_COMMUNITY): Payer: Self-pay | Admitting: *Deleted

## 2021-06-21 ENCOUNTER — Telehealth (HOSPITAL_COMMUNITY): Payer: Self-pay | Admitting: *Deleted

## 2021-06-21 MED ORDER — METOPROLOL TARTRATE 50 MG PO TABS: ORAL_TABLET | ORAL | 0 refills | Status: AC

## 2021-06-21 NOTE — Telephone Encounter (Signed)
Reaching out to patient to offer assistance regarding upcoming cardiac imaging study; pt verbalizes understanding of appt date/time, parking situation and where to check in, pre-test NPO status and medications ordered, and verified current allergies; name and call back number provided for further questions should they arise  Sofya Moustafa RN Navigator Cardiac Imaging [facility] [phone] office [phone] cell  Patient to take 50mg metoprolol tartrate two hours prior to her cardiac CT scan. She is aware to arrive at 2:30pm.  

## 2021-06-22 ENCOUNTER — Ambulatory Visit (HOSPITAL_COMMUNITY)
Admission: RE | Admit: 2021-06-22 | Discharge: 2021-06-22 | Disposition: A | Discharge status: Home / Self Care | Payer: Medicare Other | Source: Ambulatory Visit | Attending: Cardiology | Admitting: Cardiology

## 2021-06-22 DIAGNOSIS — I2510 Atherosclerotic heart disease of native coronary artery without angina pectoris: Secondary | ICD-10-CM

## 2021-06-22 DIAGNOSIS — R079 Chest pain, unspecified: Secondary | ICD-10-CM | POA: Insufficient documentation

## 2021-06-22 MED ORDER — NITROGLYCERIN 0.4 MG SL SUBL
SUBLINGUAL_TABLET | SUBLINGUAL | Status: AC
  Filled 2021-06-22: qty 2

## 2021-06-22 MED ORDER — IOHEXOL 350 MG/ML SOLN
100.0000 mL | Freq: Once | INTRAVENOUS | Status: AC | PRN
  Administered 2021-06-22: 100 mL via INTRAVENOUS

## 2021-06-22 MED ORDER — NITROGLYCERIN 0.4 MG SL SUBL
0.8000 mg | SUBLINGUAL_TABLET | Freq: Once | SUBLINGUAL | Status: AC
  Administered 2021-06-22: 0.8 mg via SUBLINGUAL

## 2021-07-06 ENCOUNTER — Other Ambulatory Visit: Payer: Self-pay

## 2021-07-06 MED ORDER — DULOXETINE HCL 30 MG PO CPEP
ORAL_CAPSULE | ORAL | 0 refills | Status: AC
  Filled 2021-07-06: qty 30, 30d supply, fill #0

## 2021-07-06 MED ORDER — DICLOFENAC SODIUM 1 % EX GEL
CUTANEOUS | 0 refills | Status: AC
  Filled 2021-07-06: qty 300, 19d supply, fill #0

## 2021-07-06 MED ORDER — OXYCODONE HCL 15 MG PO TABS
ORAL_TABLET | ORAL | 0 refills | Status: AC
  Filled 2021-07-06: qty 150, 30d supply, fill #0

## 2022-01-04 IMAGING — CR DG CHEST 2V
2 series · 2 of 2 positions shown · non-contrast
Comparison: February 21, 2020

CLINICAL DATA: Dizziness

EXAM:
CHEST - 2 VIEW

[chest pa]
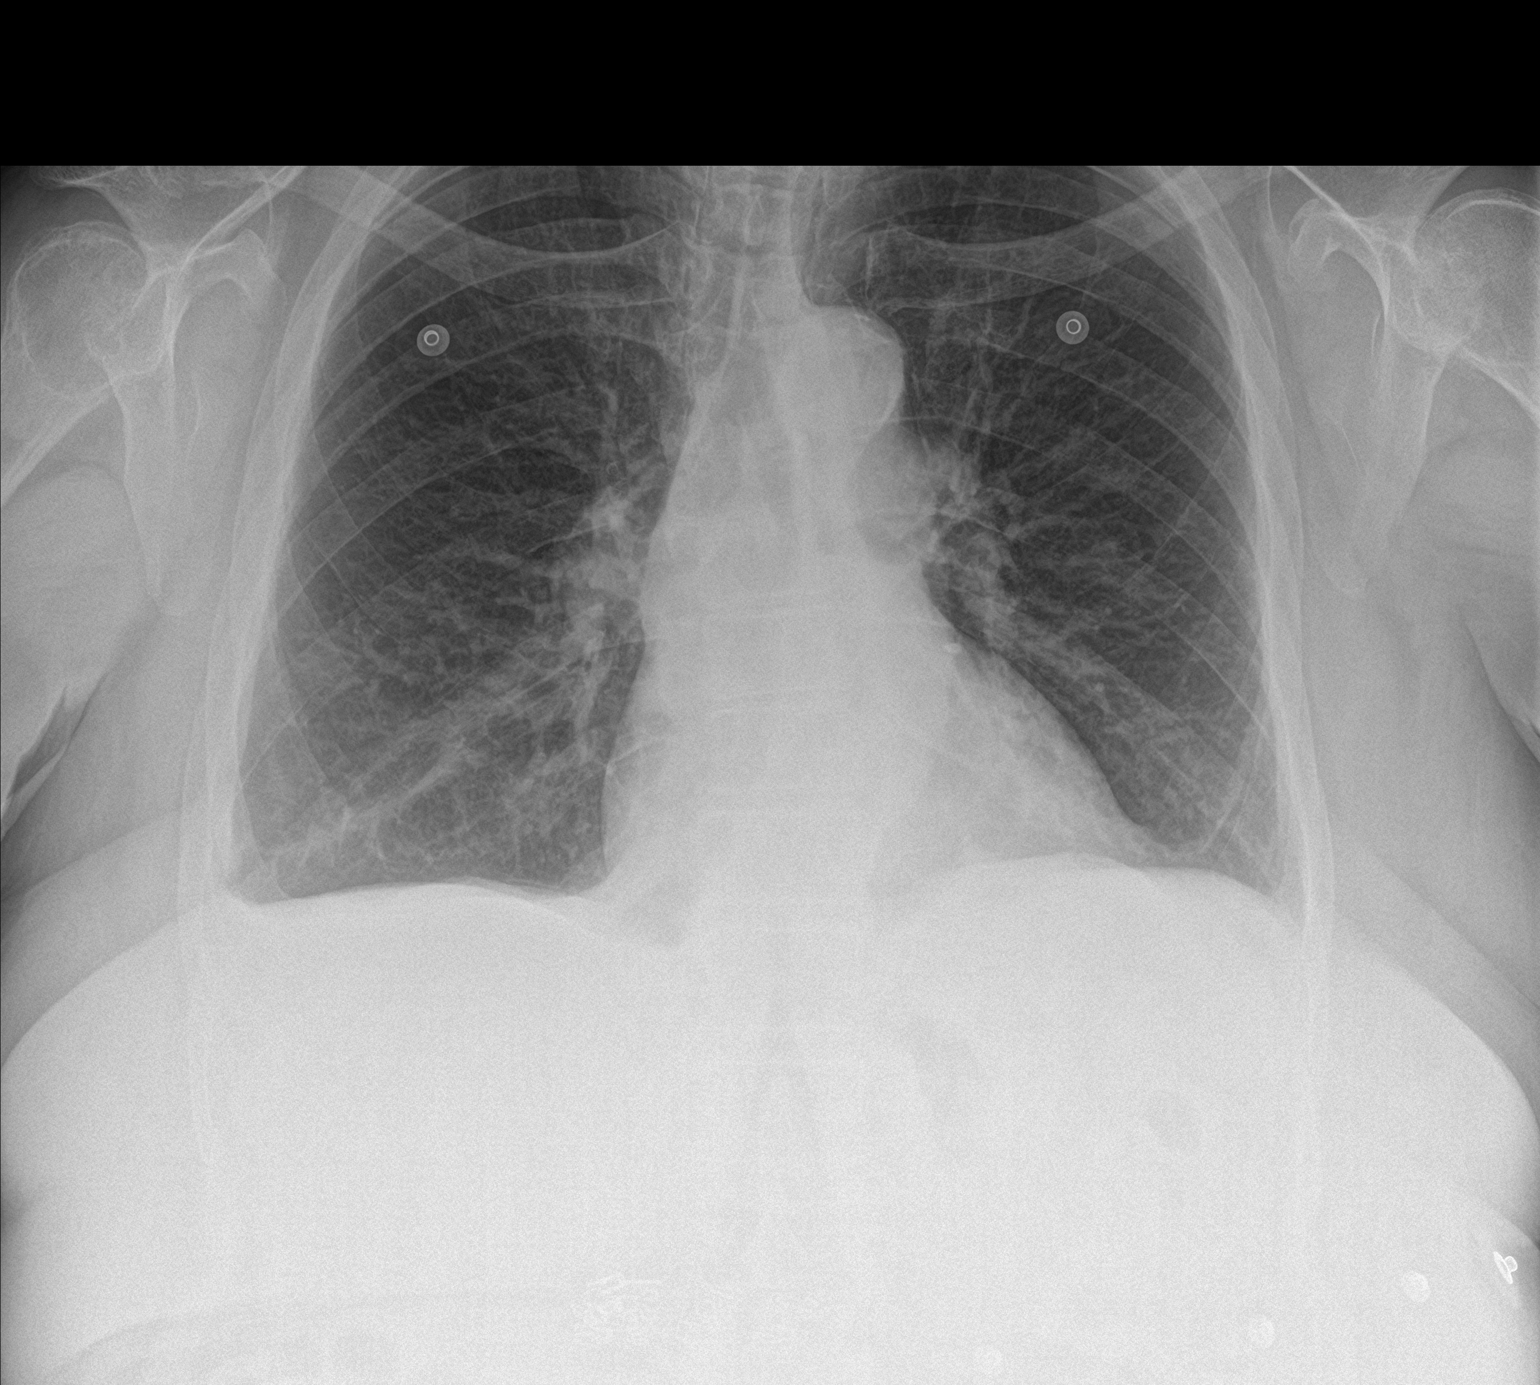

[chest lat]
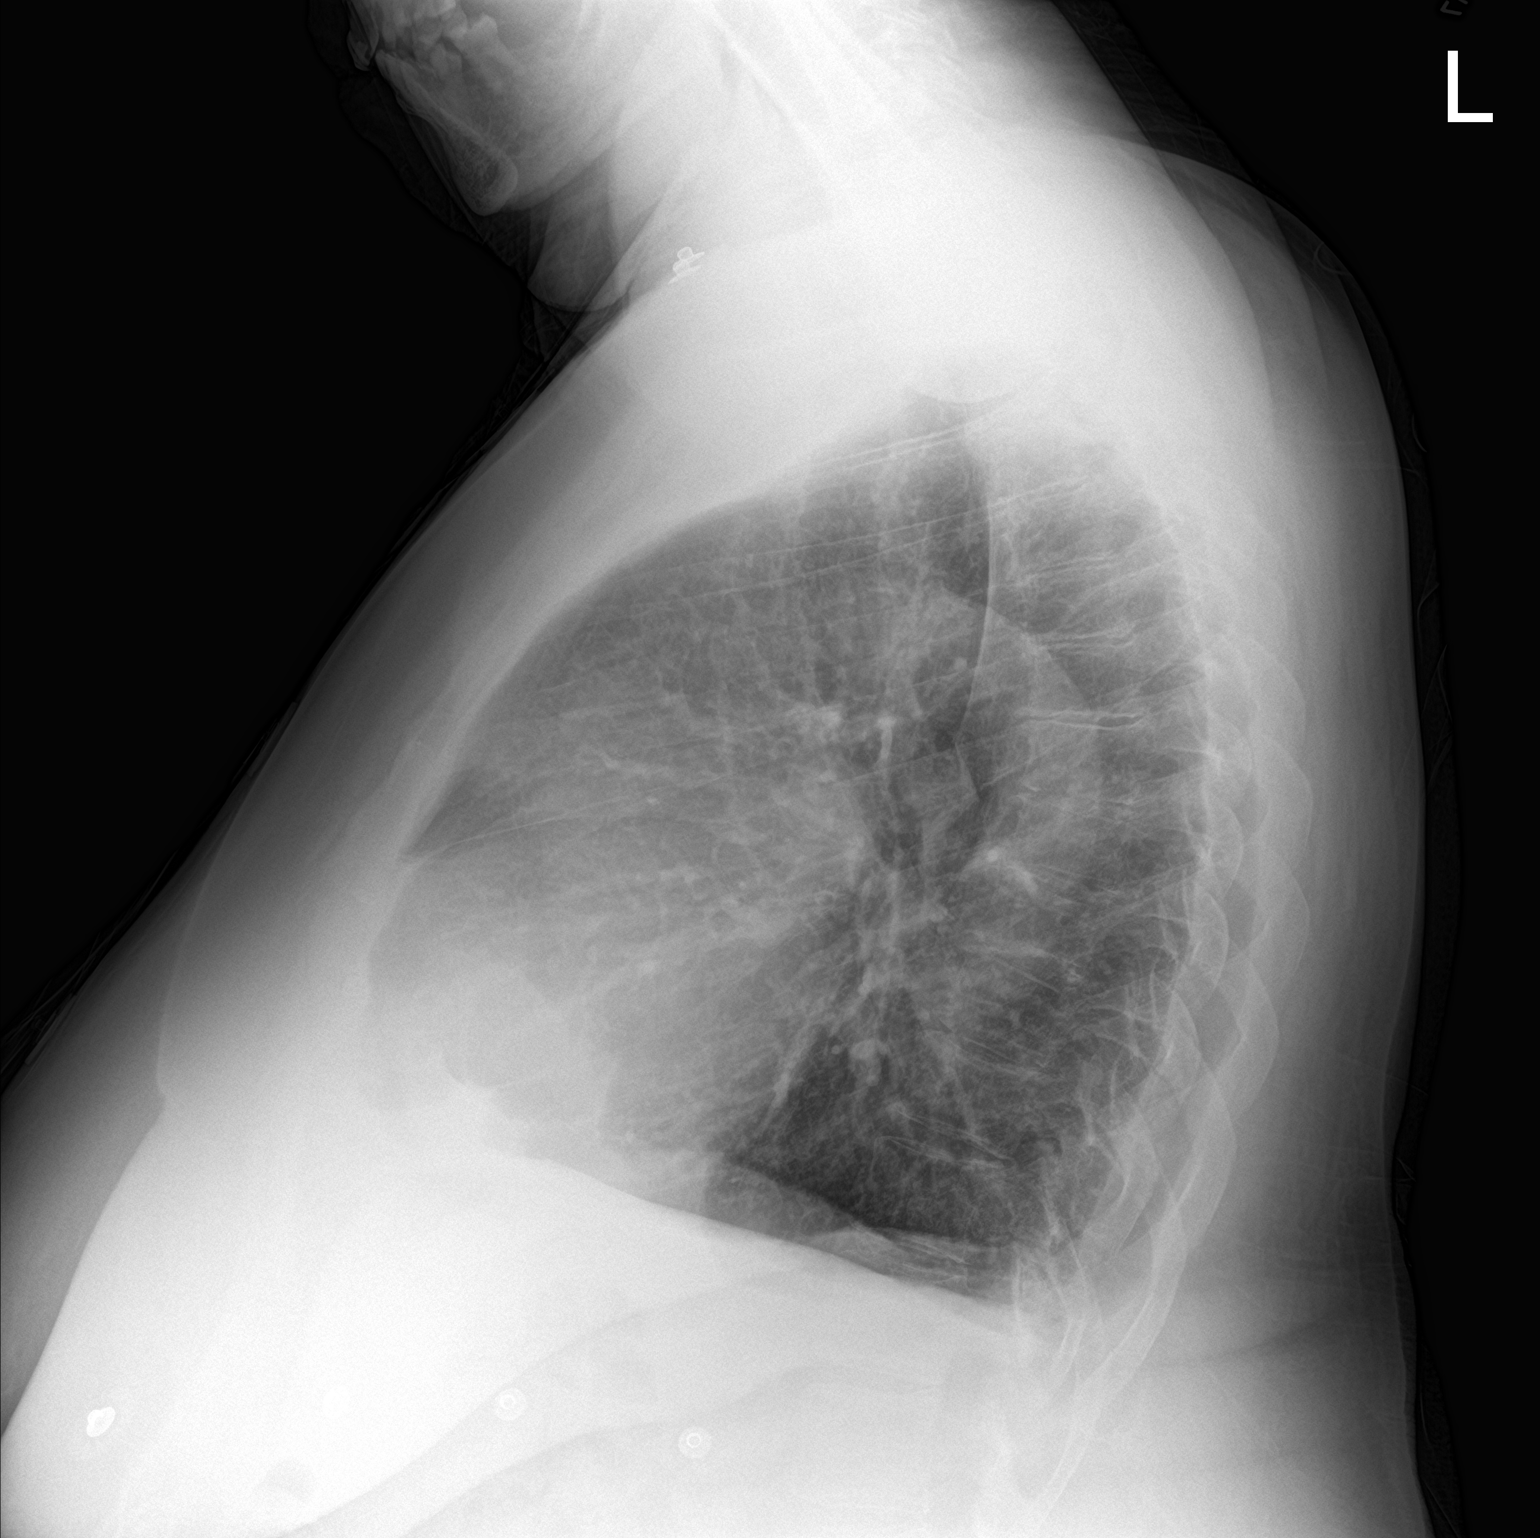

[2 of 2 positions shown; findings below may reference images not displayed]

FINDINGS: Cardiomediastinal contours are stable. Hilar structures are
unremarkable.

No consolidation or sign of pleural effusion. Chronic blunting of
the RIGHT costophrenic angle.

Lungs are hyperinflated. No acute skeletal process on limited
assessment.
IMPRESSION: Hyperinflation without acute cardiopulmonary disease. Chronic
interstitial prominence without change.

## 2022-10-15 ENCOUNTER — Other Ambulatory Visit (HOSPITAL_COMMUNITY): Payer: Self-pay

## 2022-11-25 ENCOUNTER — Other Ambulatory Visit: Payer: Self-pay

## 2022-11-25 ENCOUNTER — Emergency Department (HOSPITAL_BASED_OUTPATIENT_CLINIC_OR_DEPARTMENT_OTHER): Payer: 59

## 2022-11-25 ENCOUNTER — Emergency Department (HOSPITAL_BASED_OUTPATIENT_CLINIC_OR_DEPARTMENT_OTHER)
Admission: EM | Admit: 2022-11-25 | Discharge: 2022-11-25 | Disposition: A | Discharge status: Home / Self Care | Payer: 59 | Attending: Emergency Medicine | Admitting: Emergency Medicine

## 2022-11-25 ENCOUNTER — Encounter (HOSPITAL_BASED_OUTPATIENT_CLINIC_OR_DEPARTMENT_OTHER): Payer: Self-pay | Admitting: Urology

## 2022-11-25 DIAGNOSIS — I493 Ventricular premature depolarization: Secondary | ICD-10-CM

## 2022-11-25 DIAGNOSIS — R002 Palpitations: Secondary | ICD-10-CM | POA: Diagnosis not present

## 2022-11-25 DIAGNOSIS — F17210 Nicotine dependence, cigarettes, uncomplicated: Secondary | ICD-10-CM | POA: Diagnosis not present

## 2022-11-25 DIAGNOSIS — R079 Chest pain, unspecified: Secondary | ICD-10-CM | POA: Diagnosis present

## 2022-11-25 DIAGNOSIS — J45909 Unspecified asthma, uncomplicated: Secondary | ICD-10-CM | POA: Diagnosis not present

## 2022-11-25 DIAGNOSIS — I10 Essential (primary) hypertension: Secondary | ICD-10-CM | POA: Diagnosis not present

## 2022-11-25 DIAGNOSIS — J449 Chronic obstructive pulmonary disease, unspecified: Secondary | ICD-10-CM | POA: Diagnosis not present

## 2022-11-25 DIAGNOSIS — E876 Hypokalemia: Secondary | ICD-10-CM

## 2022-11-25 DIAGNOSIS — R0789 Other chest pain: Secondary | ICD-10-CM | POA: Diagnosis not present

## 2022-11-25 DIAGNOSIS — Z79899 Other long term (current) drug therapy: Secondary | ICD-10-CM | POA: Diagnosis not present

## 2022-11-25 LAB — BASIC METABOLIC PANEL
Anion gap: 9 (ref 5–15)
BUN: 21 mg/dL (ref 8–23)
CO2: 29 mmol/L (ref 22–32)
Calcium: 8.8 mg/dL — ABNORMAL LOW (ref 8.9–10.3)
Chloride: 101 mmol/L (ref 98–111)
Creatinine, Ser: 0.97 mg/dL (ref 0.44–1.00)
GFR, Estimated: >60 mL/min (ref >60)
Glucose, Bld: 111 mg/dL — ABNORMAL HIGH (ref 70–99)
Potassium: 3 mmol/L — ABNORMAL LOW (ref 3.5–5.1)
Sodium: 139 mmol/L (ref 135–145)

## 2022-11-25 LAB — CBC
HCT: 40.8 % (ref 36.0–46.0)
Hemoglobin: 13.9 g/dL (ref 12.0–15.0)
MCH: 30.5 pg (ref 26.0–34.0)
MCHC: 34.1 g/dL (ref 30.0–36.0)
MCV: 89.7 fL (ref 80.0–100.0)
Platelets: 216 10*3/uL (ref 150–400)
RBC: 4.55 MIL/uL (ref 3.87–5.11)
RDW: 13.6 % (ref 11.5–15.5)
WBC: 7.8 10*3/uL (ref 4.0–10.5)
nRBC: 0 % (ref 0.0–0.2)

## 2022-11-25 LAB — TROPONIN I (HIGH SENSITIVITY): Troponin I (High Sensitivity): 9 ng/L (ref <18)

## 2022-11-25 MED ORDER — SODIUM CHLORIDE 0.9 % IV BOLUS
1000.0000 mL | Freq: Once | INTRAVENOUS | Status: AC
  Administered 2022-11-25: 1000 mL via INTRAVENOUS

## 2022-11-25 MED ORDER — POTASSIUM CHLORIDE CRYS ER 20 MEQ PO TBCR
40.0000 meq | EXTENDED_RELEASE_TABLET | Freq: Once | ORAL | Status: AC
  Administered 2022-11-25: 40 meq via ORAL
  Filled 2022-11-25: qty 2

## 2022-11-25 NOTE — ED Notes (Signed)
Attempt at IV x 1 with no result, IV infiltrated.  Pressure dressing applied to area.

## 2022-11-25 NOTE — ED Provider Notes (Signed)
[facility] Provider Note  CSN: [id number] Arrival date & time: 11/25/22 1605  Chief Complaint(s) Chest Pain  HPI Kimberly Gibbs is a 72 y.o. female with past medical history as below, significant for asthma, COPD, hypertension who presents to the ED with complaint of abnormal sensation in her chest, palpitations, dyspnea  Patient reports discomfort to her chest over the past year.  Does not describe it necessarily as pain; reports that feels like she has "rocks in my chest."  Sometimes the rock sensation moves up towards her throat and taste like she has metal in her mouth.  She spoke with her PCP in regards to this and she reports that he told her that it was acid reflux.  Has been having intermittent difficulty breathing over the past year in conjunction with the chest sensation.  Worsened with exertion.  She does not have the dyspnea independently from the sensation in her chest.  She is also having palpitations intermittently in conjunction with the discomfort in her chest.  Last time she felt this sensation was that she was walking into the emergency department.  She is currently symptomatic.  No recent diet or medication changes.  No numbness, tingling, diaphoresis, nausea or vomiting, syncope.  Past Medical History Past Medical History:  Diagnosis Date   Asthma    COPD (chronic obstructive pulmonary disease) (HCC)    Hypertension    There are no problems to display for this patient.  Home Medication(s) Prior to Admission medications   Medication Sig Start Date End Date Taking? Authorizing Provider  albuterol (PROVENTIL) (2.5 MG/3ML) 0.083% nebulizer solution Take 3 mLs (2.5 mg total) by nebulization every 6 (six) hours as needed for wheezing or shortness of breath. 04/18/20   Wallis Bamberg, PA-C  albuterol (VENTOLIN HFA) 108 (90 Base) MCG/ACT inhaler Inhale 1-2 puffs into the lungs every 6 (six) hours as needed for wheezing or shortness of  breath. 04/18/20   Wallis Bamberg, PA-C  BREO ELLIPTA 200-25 MCG/INH AEPB Inhale 1 puff into the lungs daily. 12/08/19   [provider]  diclofenac Sodium (VOLTAREN) 1 % GEL apply 4 gram to painful knees up to 4 times daily if needed 07/06/21     DULoxetine (CYMBALTA) 30 MG capsule Take 1 (one) capsule by mouth daily with food 07/06/21     esomeprazole (NEXIUM) 40 MG capsule Take 40 mg by mouth daily.      [provider]  gabapentin (NEURONTIN) 300 MG capsule Take 300 mg by mouth 3 (three) times daily. 01/11/20   [provider]  HYDROcodone-acetaminophen (NORCO/VICODIN) 5-325 MG tablet Take 2 tablets by mouth every 4 (four) hours as needed. 03/22/21   Roemhildt, Lorin T, PA-C  Ipratropium-Albuterol (COMBIVENT) 20-100 MCG/ACT AERS respimat Inhale 1 puff into the lungs every 6 (six) hours as needed for wheezing or shortness of breath. For shortness of breath    [provider]  levocetirizine (XYZAL) 5 MG tablet Take 1 tablet (5 mg total) by mouth every evening. Patient not taking: Reported on 06/22/2021 04/18/20   Wallis Bamberg, PA-C  losartan-hydrochlorothiazide (HYZAAR) 100-25 MG tablet Take 1 tablet by mouth daily. 02/03/20   [provider]  meloxicam (MOBIC) 15 MG tablet Take 15 mg by mouth daily. Patient not taking: Reported on 06/22/2021 01/11/20   [provider]  metoprolol tartrate (LOPRESSOR) 50 MG tablet Take 1 tablet (50 mg total) by mouth once for 1 dose. Take 2 hr prior to CT scan 04/16/21 04/16/21  Coralee Rud, PA-C  metoprolol tartrate (LOPRESSOR) 50 MG tablet Take tablet (50mg ) TWO hours prior to your cardiac CT scan. Hold your Losartan. 06/21/21   O'NealRonnald Ramp, MD  montelukast (SINGULAIR) 10 MG tablet Take 1 tablet (10 mg total) by mouth at bedtime. 04/18/20   Wallis Bamberg, PA-C  nitroGLYCERIN (NITROSTAT) 0.4 MG SL tablet Place 0.4 mg under the tongue every 5 (five) minutes as needed for chest pain. 02/10/20   [provider]   NURTEC 75 MG TBDP Take 1 tablet by mouth daily as needed. Patient not taking: Reported on 06/22/2021 01/11/20   [provider]  oxyCODONE (ROXICODONE) 15 MG immediate release tablet Take 15 mg by mouth 5 (five) times daily. 02/10/20   [provider]  oxyCODONE (ROXICODONE) 15 MG immediate release tablet Take 1 (one) Tablet 5 times a day, max daily dose: 5 Tablet 07/06/21     potassium chloride (KLOR-CON) 10 MEQ tablet Take 1 tablet (10 mEq total) by mouth daily for 7 days. Patient not taking: Reported on 02/21/2020 02/05/19 02/12/19  Tanda Rockers, PA-C  potassium chloride SA (KLOR-CON) 20 MEQ tablet Take 20 mEq by mouth daily.    [provider]  potassium chloride SA (KLOR-CON) 20 MEQ tablet Take 2 tablets (40 mEq total) by mouth 2 (two) times daily. 02/21/20   Tegeler, Canary Brim, MD  predniSONE (DELTASONE) 50 MG tablet Take 1 tablet (50 mg total) by mouth daily with breakfast. 06/21/20   Wallis Bamberg, PA-C  SPIRIVA RESPIMAT 1.25 MCG/ACT AERS Inhale 2 puffs into the lungs daily. 12/08/19   [provider]  theophylline (UNIPHYL) 400 MG 24 hr tablet Take 400 mg by mouth 2 (two) times daily. 02/04/20   [provider]  traZODone (DESYREL) 100 MG tablet Take 100 mg by mouth at bedtime. Patient not taking: Reported on 06/22/2021 11/11/19   [provider]  TROKENDI XR 50 MG CP24 Take 1 capsule by mouth daily. Patient not taking: Reported on 06/22/2021 02/10/20   [provider]  verapamil (CALAN-SR) 120 MG CR tablet Take 120 mg by mouth daily. 02/08/20   [provider]  VIBERZI 100 MG TABS Take 1 tablet by mouth 2 (two) times daily. Patient not taking: Reported on 06/22/2021 10/11/19   [provider]                                                                                                                                    Past Surgical History Past Surgical History:  Procedure Laterality Date   FOOT SURGERY     Family  History Family History  Family history unknown: Yes    Social History Social History   Tobacco Use   Smoking status: Every Day    Current packs/day: 0.50    Types: Cigarettes   Smokeless tobacco: Never  Substance Use Topics   Alcohol use: Yes    Comment: rarely  Drug use: No   Allergies Aspirin, Latex, Penicillins, and Ampicillin  Review of Systems Review of Systems  Constitutional:  Negative for chills and fever.  Respiratory:  Negative for cough and wheezing.   Cardiovascular:  Positive for chest pain and palpitations. Negative for leg swelling.  Gastrointestinal:  Negative for abdominal pain.  Genitourinary:  Negative for difficulty urinating.  Skin:  Negative for rash.  Neurological:  Negative for light-headedness, numbness and headaches.  All other systems reviewed and are negative.   Physical Exam Vital Signs  I have reviewed the triage vital signs BP (!) 110/50   Pulse 72   Temp 99.6 F (37.6 C)   Resp 18   Ht 5\' 6"  (1.676 m)   Wt 106.6 kg   SpO2 100%   BMI 37.93 kg/m  Physical Exam Vitals and nursing note reviewed.  Constitutional:      General: She is not in acute distress.    Appearance: Normal appearance.  HENT:     Head: Normocephalic and atraumatic.     Right Ear: External ear normal.     Left Ear: External ear normal.     Nose: Nose normal.     Mouth/Throat:     Mouth: Mucous membranes are moist.  Eyes:     General: No scleral icterus.       Right eye: No discharge.        Left eye: No discharge.  Cardiovascular:     Rate and Rhythm: Regular rhythm. Tachycardia present.     Pulses: Normal pulses.     Heart sounds: Normal heart sounds.  Pulmonary:     Effort: Pulmonary effort is normal. No respiratory distress.     Breath sounds: Normal breath sounds. No stridor.  Abdominal:     General: Abdomen is flat. There is no distension.     Palpations: Abdomen is soft.     Tenderness: There is no abdominal tenderness.  Musculoskeletal:      Cervical back: No rigidity.     Right lower leg: No edema.     Left lower leg: No edema.  Skin:    General: Skin is warm and dry.     Capillary Refill: Capillary refill takes less than 2 seconds.  Neurological:     Mental Status: She is alert and oriented to person, place, and time.     GCS: GCS eye subscore is 4. GCS verbal subscore is 5. GCS motor subscore is 6.  Psychiatric:        Mood and Affect: Mood normal.        Behavior: Behavior normal. Behavior is cooperative.     ED Results and Treatments Labs (all labs ordered are listed, but only abnormal results are displayed) Labs Reviewed  BASIC METABOLIC PANEL - Abnormal; Notable for the following components:      Result Value   Potassium 3.0 (*)    Glucose, Bld 111 (*)    Calcium 8.8 (*)    All other components within normal limits  CBC  TSH  T4, FREE  TROPONIN I (HIGH SENSITIVITY)  TROPONIN I (HIGH SENSITIVITY)  Radiology DG Chest 2 View  Result Date: 11/25/2022 CLINICAL DATA:  Chest pain EXAM: CHEST - 2 VIEW COMPARISON:  Chest x-ray 06/21/2020 FINDINGS: There is stable blunting of the right costophrenic angle favored as scarring. There is no focal lung infiltrate, pleural effusion or pneumothorax. The cardiomediastinal silhouette is within normal limits. No acute fractures are seen. IMPRESSION: No active cardiopulmonary disease. Electronically Signed   By: Darliss Cheney M.D.   On: 11/25/2022 19:31    Pertinent labs & imaging results that were available during my care of the patient were reviewed by me and considered in my medical decision making (see MDM for details).  Medications Ordered in ED Medications  sodium chloride 0.9 % bolus 1,000 mL (0 mLs Intravenous Stopped 11/25/22 2020)  potassium chloride SA (KLOR-CON M) CR tablet 40 mEq (40 mEq Oral Given 11/25/22 1754)                                                                                                                                      Procedures Procedures  (including critical care time)  Medical Decision Making / ED Course    Medical Decision Making:    Lunabelle Carucci is a 72 y.o. female  with past medical history as below, significant for asthma, COPD, hypertension who presents to the ED with complaint of pain, palpitations, dyspnea. The complaint involves an extensive differential diagnosis and also carries with it a high risk of complications and morbidity.  Serious etiology was considered. Ddx includes but is not limited to: Differential includes all life-threatening causes for chest pain. This includes but is not exclusive to acute coronary syndrome, aortic dissection, pulmonary embolism, cardiac tamponade, community-acquired pneumonia, pericarditis, musculoskeletal chest wall pain, etc. In my evaluation of this patient's dyspnea my DDx includes, but is not limited to, pneumonia, pulmonary embolism, pneumothorax, pulmonary edema, metabolic acidosis, asthma, COPD, cardiac cause, anemia, anxiety, etc.    Complete initial physical exam performed, notably the patient  was no acute distress, heart rate was mildly elevated..    Reviewed and confirmed nursing documentation for past medical history, family history, social history.  Vital signs reviewed.    Clinical Course as of 11/25/22 2028  Mon Nov 25, 2022  [birth year]29 Pt refusing IV for CTA, refusing any more blood work or lab draws. She remains asymptomatic.  [SG]  1937 Chest x-ray unremarkable.  She is refusing any further workup at this point [SG]    Clinical Course User Index [SG] Sloan Leiter, DO     Patient with abnormal sensation to her chest for over a year in conjunction with difficulty breathing and intermittent palpitations.  Will check screening labs, imaging of the chest.  Give IV fluids.  Replete electrolytes  She is currently symptomatic  Labs  stable at this time, does not any further labs or imaging, requesting to leave. Pt walking out of the ED, she was in process of  elope, fortunately I was able to speak to the patient as she was walking to the lobby and was able to give her follow up instructions and referral to cardiology. Affirmed she did not want any further workup and wanted to leave immediately. I favor her palpitation sensation likely due to PVCs, multiple PVCs on telemetry monitoring while in the room speak with the patient.  Her chest pain has been ongoing intermittently over the past year.  Has not acutely worsened today.  Troponin is not elevated, EKG without acute ischemic changes.  Suspicion for ACS is reduced.  She remains asymptomatic while in the ER.  Advised her to follow with cardiology, referral sent.  May benefit from [facility] patch or event recorder.  Smoking cessation advised. Pt eloped.                  Additional history obtained: -Additional history obtained from family -External records from outside source obtained and reviewed including: Chart review including previous notes, labs, imaging, consultation notes including  Primary care documentation, prior ED visits   Lab Tests: -I ordered, reviewed, and interpreted labs.   The pertinent results include:   Labs Reviewed  BASIC METABOLIC PANEL - Abnormal; Notable for the following components:      Result Value   Potassium 3.0 (*)    Glucose, Bld 111 (*)    Calcium 8.8 (*)    All other components within normal limits  CBC  TSH  T4, FREE  TROPONIN I (HIGH SENSITIVITY)  TROPONIN I (HIGH SENSITIVITY)    Notable for hypokalemia  EKG   EKG Interpretation Date/Time:  Monday November 25 2022 16:17:17 EST Ventricular Rate:  115 PR Interval:  174 QRS Duration:  84 QT Interval:  314 QTC Calculation: 434 R Axis:   -27  Text Interpretation: Sinus tachycardia with Premature atrial complexes with Abberant conduction Inferior infarct , age  undetermined Anterolateral infarct , age undetermined Abnormal ECG When compared with ECG of 02-Mar-2020 15:03, PREVIOUS ECG IS PRESENT Confirmed by Tanda Rockers ([id number]) on 11/25/2022 4:32:50 PM         Imaging Studies ordered: I ordered imaging studies including chest x-ray I independently visualized the following imaging with scope of interpretation limited to determining acute life threatening conditions related to emergency care; findings noted above I independently visualized and interpreted imaging. I agree with the radiologist interpretation   Medicines ordered and prescription drug management: Meds ordered this encounter  Medications   sodium chloride 0.9 % bolus 1,000 mL   potassium chloride SA (KLOR-CON M) CR tablet 40 mEq    -I have reviewed the patients home medicines and have made adjustments as needed   Consultations Obtained: na   Cardiac Monitoring: The patient was maintained on a cardiac monitor.  I personally viewed and interpreted the cardiac monitored which showed an underlying rhythm of: NSR w/ occ PVC's Continuous pulse oximetry interpreted by myself, 99% on RA.    Social Determinants of Health:  Diagnosis or treatment significantly limited by social determinants of health: current smoker   Reevaluation: After the interventions noted above, I reevaluated the patient and found that they have improved  Co morbidities that complicate the patient evaluation  Past Medical History:  Diagnosis Date   Asthma    COPD (chronic obstructive pulmonary disease) (HCC)    Hypertension       Dispostion: Disposition decision including need for hospitalization was considered, and patient discharged from emergency department.    Final Clinical Impression(s) / ED  Diagnoses Final diagnoses:  Palpitations  Hypokalemia  Atypical chest pain  PVC (premature ventricular contraction)        Sloan Leiter, DO 11/25/22 2029

## 2022-11-25 NOTE — ED Triage Notes (Signed)
Pt states " I feel like I have rocks in my chest" that started at 1430 today  Pt states cough x 1 month, had negative covid test 2 weeks ago  H/o COPD

## 2022-11-25 NOTE — ED Notes (Signed)
Primary RN informed this RN that the pt would like to speak to the charge RN. Pt upset about the wait to get her test results and for the doctor to come back in the room. Pt informed that the only thing they were waiting on was her xray to come back which had recently resulted. Pt continues to be upset, stating that she wants her discharge papers. Pt informed that she would have to be discharged by the doctor to receive papers and that if she left without being discharged she would be leaving AMA, pt stated that she would wait until 2020 for the EDP to come in or she was leaving. EDP made aware.

## 2022-11-25 NOTE — ED Notes (Addendum)
Primary nurse attempted to assess pt after coming onto shift, pt refused assessment. Told primary nurse "I'm ready for you to take this IV out and send me home now" pt informed that primary nurse must wait for clearance from EDP for discharge. Pt does not display any obvious signs of distress, pt on phone during discussion. Daughter at bedside w pt

## 2022-11-25 NOTE — ED Notes (Signed)
Pt is refusing to an IV at this, stating she does not want to be "stuck" anymore. Provider made aware.

## 2022-11-25 NOTE — ED Notes (Signed)
Pt refused to keep BP cuff, EKG leads or pulse ox on while waiting for DC. Pt educated that we are unable to obtain and monitor vital signs without it which are crucial to providing and maintaining care and safety, pt stated understanding

## 2022-11-25 NOTE — ED Notes (Signed)
This RN attempted [place] IV x 2 - unsuccessful. Primary RN informed.

## 2022-11-25 NOTE — ED Notes (Signed)
Fall risk armband Fall risk sign on door Patient wearing shoes

## 2022-11-25 NOTE — Discharge Instructions (Signed)
It was a pleasure caring for you today in the emergency department. ° °Please return to the emergency department for any worsening or worrisome symptoms. ° ° °

## 2023-08-14 ENCOUNTER — Ambulatory Visit (INDEPENDENT_AMBULATORY_CARE_PROVIDER_SITE_OTHER)

## 2023-08-14 ENCOUNTER — Encounter (HOSPITAL_COMMUNITY): Payer: Self-pay

## 2023-08-14 ENCOUNTER — Ambulatory Visit (HOSPITAL_COMMUNITY): Admission: EM | Admit: 2023-08-14 | Discharge: 2023-08-14 | Disposition: A | Discharge status: Home / Self Care

## 2023-08-14 DIAGNOSIS — S20211A Contusion of right front wall of thorax, initial encounter: Secondary | ICD-10-CM

## 2023-08-14 DIAGNOSIS — R0781 Pleurodynia: Secondary | ICD-10-CM | POA: Diagnosis not present

## 2023-08-14 NOTE — ED Triage Notes (Signed)
 Patient presenting with right side rib pain after a fall onset 2 days ago. Patient was walking out of the hospital and there was water she slipped on.   Prescriptions or OTC medications tried: Yes- Biofreeze    with no relief

## 2023-08-14 NOTE — Discharge Instructions (Addendum)
  1. Rib contusion, right, initial encounter (Primary) - DG Ribs Unilateral W/Chest Right x-ray performed in UC shows no acute fracture or dislocation of the right anterior ribs. - Continue with previously prescribed pain medication for pain and inflammation. - Apply ice to the anterior ribs to decrease inflammation and pain. - Use a pillow for bracing whenever you have to sneeze or cough as this may decrease chest expansion and minimize pain. - If symptoms worsen or you develop new symptoms follow-up in ER for further evaluation management.

## 2023-08-14 NOTE — ED Provider Notes (Signed)
 [facility]  Note:  This document was prepared using Dragon voice recognition software and may include unintentional dictation errors.  MRN: [id number] DOB: [birth year]  Subjective:   Kimberly Gibbs is a 73 y.o. female presenting for right anterior rib pain following a fall that occurred 2 days ago.  Patient reports that she fell forward landing on her chest wall after which she had pain to the ribs.  Patient thought that it was just bruised ribs but is still having pain 2 days later with pain medication taken every 6 hours.  Patient reports that she takes oxycodone  for pain 4 times daily and is still having rib pain.  Was concern for possible fracture.  Patient denies any past injuries to the chest wall or rib fractures.  No difficulty breathing or weakness but is having chest wall pain and pain with deep breathing.  No current facility-administered medications for this encounter.  Current Outpatient Medications:    albuterol  (PROVENTIL ) (2.5 MG/3ML) 0.083% nebulizer solution, Take 3 mLs (2.5 mg total) by nebulization every 6 (six) hours as needed for wheezing or shortness of breath., Disp: 75 mL, Rfl: 0   albuterol  (VENTOLIN  HFA) 108 (90 Base) MCG/ACT inhaler, Inhale 1-2 puffs into the lungs every 6 (six) hours as needed for wheezing or shortness of breath., Disp: 18 g, Rfl: 0   BREO ELLIPTA 200-25 MCG/INH AEPB, Inhale 1 puff into the lungs daily., Disp: , Rfl:    diclofenac  Sodium (VOLTAREN ) 1 % GEL, apply 4 gram to painful knees up to 4 times daily if needed, Disp: 300 g, Rfl: 0   esomeprazole (NEXIUM) 40 MG capsule, Take 40 mg by mouth daily.  , Disp: , Rfl:    gabapentin (NEURONTIN) 300 MG capsule, Take 300 mg by mouth 3 (three) times daily., Disp: , Rfl:    HYDROcodone -acetaminophen  (NORCO/VICODIN) 5-325 MG tablet, Take 2 tablets by mouth every 4 (four) hours as needed., Disp: 10 tablet, Rfl: 0   Ipratropium-Albuterol  (COMBIVENT) 20-100 MCG/ACT AERS respimat, Inhale 1 puff  into the lungs every 6 (six) hours as needed for wheezing or shortness of breath. For shortness of breath, Disp: , Rfl:    losartan-hydrochlorothiazide (HYZAAR) 100-25 MG tablet, Take 1 tablet by mouth daily., Disp: , Rfl:    montelukast  (SINGULAIR ) 10 MG tablet, Take 1 tablet (10 mg total) by mouth at bedtime., Disp: 90 tablet, Rfl: 0   NURTEC 75 MG TBDP, Take 1 tablet by mouth daily as needed., Disp: , Rfl:    oxyCODONE  (ROXICODONE ) 15 MG immediate release tablet, Take 15 mg by mouth 5 (five) times daily., Disp: , Rfl:    oxyCODONE  (ROXICODONE ) 15 MG immediate release tablet, Take 1 (one) Tablet 5 times a day, max daily dose: 5 Tablet, Disp: 150 tablet, Rfl: 0   potassium chloride  SA (KLOR-CON ) 20 MEQ tablet, Take 20 mEq by mouth daily., Disp: , Rfl:    potassium chloride  SA (KLOR-CON ) 20 MEQ tablet, Take 2 tablets (40 mEq total) by mouth 2 (two) times daily., Disp: 20 tablet, Rfl: 0   predniSONE  (DELTASONE ) 50 MG tablet, Take 1 tablet (50 mg total) by mouth daily with breakfast., Disp: 5 tablet, Rfl: 0   SPIRIVA RESPIMAT 1.25 MCG/ACT AERS, Inhale 2 puffs into the lungs daily., Disp: , Rfl:    verapamil (CALAN-SR) 120 MG CR tablet, Take 120 mg by mouth daily., Disp: , Rfl:    DULoxetine  (CYMBALTA ) 30 MG capsule, Take 1 (one) capsule by mouth daily with food, Disp: 30 capsule, Rfl:  0   levocetirizine (XYZAL ) 5 MG tablet, Take 1 tablet (5 mg total) by mouth every evening. (Patient not taking: Reported on 06/22/2021), Disp: 90 tablet, Rfl: 0   metoprolol  tartrate (LOPRESSOR ) 50 MG tablet, Take 1 tablet (50 mg total) by mouth once for 1 dose. Take 2 hr prior to CT scan, Disp: 1 tablet, Rfl: 0   metoprolol  tartrate (LOPRESSOR ) 50 MG tablet, Take tablet (50mg ) TWO hours prior to your cardiac CT scan. Hold your Losartan., Disp: 1 tablet, Rfl: 0   nitroGLYCERIN  (NITROSTAT ) 0.4 MG SL tablet, Place 0.4 mg under the tongue every 5 (five) minutes as needed for chest pain., Disp: , Rfl:    potassium chloride   (KLOR-CON ) 10 MEQ tablet, Take 1 tablet (10 mEq total) by mouth daily for 7 days. (Patient not taking: Reported on 02/21/2020), Disp: 7 tablet, Rfl: 0   theophylline (UNIPHYL) 400 MG 24 hr tablet, Take 400 mg by mouth 2 (two) times daily., Disp: , Rfl:    traZODone (DESYREL) 100 MG tablet, Take 100 mg by mouth at bedtime. (Patient not taking: Reported on 06/22/2021), Disp: , Rfl:    TROKENDI XR 50 MG CP24, Take 1 capsule by mouth daily. (Patient not taking: Reported on 06/22/2021), Disp: , Rfl:    VIBERZI 100 MG TABS, Take 1 tablet by mouth 2 (two) times daily. (Patient not taking: Reported on 06/22/2021), Disp: , Rfl:    Allergies  Allergen Reactions   Aspirin Hives   Latex Hives   Penicillins Rash and Hives   Ampicillin     Past Medical History:  Diagnosis Date   Asthma    COPD (chronic obstructive pulmonary disease) (HCC)    Hypertension      Past Surgical History:  Procedure Laterality Date   FOOT SURGERY      Family History  Family history unknown: Yes    Social History   Tobacco Use   Smoking status: Every Day    Current packs/day: 0.50    Types: Cigarettes   Smokeless tobacco: Never  Vaping Use   Vaping status: Never Used  Substance Use Topics   Alcohol use: Yes    Comment: rarely   Drug use: No    ROS Refer to HPI for ROS details.  Objective:   Vitals: BP 104/69 (BP Location: Left Arm)   Pulse 73   Temp 98.1 F (36.7 C) (Oral)   Resp 18   Ht 5' 6 (1.676 m)   SpO2 95%   BMI 37.93 kg/m   Physical Exam Vitals and nursing note reviewed.  Constitutional:      General: She is not in acute distress.    Appearance: Normal appearance. She is well-developed. She is not ill-appearing or toxic-appearing.  HENT:     Head: Normocephalic and atraumatic.  Cardiovascular:     Rate and Rhythm: Normal rate and regular rhythm.     Heart sounds: Normal heart sounds. No murmur heard. Pulmonary:     Effort: Pulmonary effort is normal. No respiratory distress.      Breath sounds: Normal breath sounds. No stridor. No wheezing.  Chest:     Chest wall: Tenderness present.  Skin:    General: Skin is warm and dry.  Neurological:     General: No focal deficit present.     Mental Status: She is alert and oriented to person, place, and time.  Psychiatric:        Mood and Affect: Mood normal.  Behavior: Behavior normal.     Procedures  No results found for this or any previous visit (from the past 24 hours).  DG Ribs Unilateral W/Chest Right Result Date: 08/14/2023 CLINICAL DATA:  Right chest wall pain. EXAM: RIGHT RIBS AND CHEST - 3+ VIEW COMPARISON:  Chest radiograph dated 11/25/2022. FINDINGS: Bibasilar atelectasis/scarring. Blunting of the right costophrenic angle, likely chronic and related to scarring. A small right pleural effusion is not excluded. No pneumothorax. The cardiac silhouette is within limits. Atherosclerotic calcification of the aorta. No acute osseous pathology. No displaced rib fractures. IMPRESSION: 1. No displaced rib fractures. 2. Bibasilar atelectasis/scarring. Electronically Signed   By: Vanetta Chou M.D.   On: 08/14/2023 18:31     Assessment and Plan :     Discharge Instructions       1. Rib contusion, right, initial encounter (Primary) - DG Ribs Unilateral W/Chest Right x-ray performed in UC shows no acute fracture or dislocation of the right anterior ribs. - Continue with previously prescribed pain medication for pain and inflammation. - Apply ice to the anterior ribs to decrease inflammation and pain. - Use a pillow for bracing whenever you have to sneeze or cough as this may decrease chest expansion and minimize pain. - If symptoms worsen or you develop new symptoms follow-up in ER for further evaluation management.      Charan Prieto B Kristapher [place]   Marce Schartz, Ceex Haci B, [place] 08/14/23 [phone]

## 2024-01-19 ENCOUNTER — Emergency Department (HOSPITAL_BASED_OUTPATIENT_CLINIC_OR_DEPARTMENT_OTHER)

## 2024-01-19 ENCOUNTER — Other Ambulatory Visit: Payer: Self-pay

## 2024-01-19 ENCOUNTER — Encounter (HOSPITAL_BASED_OUTPATIENT_CLINIC_OR_DEPARTMENT_OTHER): Payer: Self-pay | Admitting: Emergency Medicine

## 2024-01-19 ENCOUNTER — Emergency Department (HOSPITAL_BASED_OUTPATIENT_CLINIC_OR_DEPARTMENT_OTHER)
Admission: EM | Admit: 2024-01-19 | Discharge: 2024-01-19 | Disposition: A | Discharge status: Home / Self Care | Attending: Emergency Medicine | Admitting: Emergency Medicine

## 2024-01-19 DIAGNOSIS — K5792 Diverticulitis of intestine, part unspecified, without perforation or abscess without bleeding: Secondary | ICD-10-CM

## 2024-01-19 DIAGNOSIS — Z91040 Latex allergy status: Secondary | ICD-10-CM | POA: Diagnosis not present

## 2024-01-19 DIAGNOSIS — J4489 Other specified chronic obstructive pulmonary disease: Secondary | ICD-10-CM | POA: Insufficient documentation

## 2024-01-19 DIAGNOSIS — I10 Essential (primary) hypertension: Secondary | ICD-10-CM | POA: Diagnosis not present

## 2024-01-19 DIAGNOSIS — K5732 Diverticulitis of large intestine without perforation or abscess without bleeding: Secondary | ICD-10-CM | POA: Insufficient documentation

## 2024-01-19 DIAGNOSIS — Z79899 Other long term (current) drug therapy: Secondary | ICD-10-CM | POA: Insufficient documentation

## 2024-01-19 DIAGNOSIS — R1031 Right lower quadrant pain: Secondary | ICD-10-CM | POA: Diagnosis present

## 2024-01-19 LAB — CBC
HCT: 41.1 % (ref 36.0–46.0)
Hemoglobin: 13.6 g/dL (ref 12.0–15.0)
MCH: 30.2 pg (ref 26.0–34.0)
MCHC: 33.1 g/dL (ref 30.0–36.0)
MCV: 91.1 fL (ref 80.0–100.0)
Platelets: 273 K/uL (ref 150–400)
RBC: 4.51 MIL/uL (ref 3.87–5.11)
RDW: 14.2 % (ref 11.5–15.5)
WBC: 11.3 K/uL — ABNORMAL HIGH (ref 4.0–10.5)
nRBC: 0 % (ref 0.0–0.2)

## 2024-01-19 LAB — COMPREHENSIVE METABOLIC PANEL WITH GFR
ALT: 6 U/L (ref 0–44)
AST: 16 U/L (ref 15–41)
Albumin: 3.9 g/dL (ref 3.5–5.0)
Alkaline Phosphatase: 75 U/L (ref 38–126)
Anion gap: 12 (ref 5–15)
BUN: 13 mg/dL (ref 8–23)
CO2: 27 mmol/L (ref 22–32)
Calcium: 8.7 mg/dL — ABNORMAL LOW (ref 8.9–10.3)
Chloride: 99 mmol/L (ref 98–111)
Creatinine, Ser: 0.78 mg/dL (ref 0.44–1.00)
GFR, Estimated: >60 mL/min
Glucose, Bld: 145 mg/dL — ABNORMAL HIGH (ref 70–99)
Potassium: 3.2 mmol/L — ABNORMAL LOW (ref 3.5–5.1)
Sodium: 138 mmol/L (ref 135–145)
Total Bilirubin: 0.5 mg/dL (ref 0.0–1.2)
Total Protein: 6.6 g/dL (ref 6.5–8.1)

## 2024-01-19 LAB — URINALYSIS, ROUTINE W REFLEX MICROSCOPIC
Bilirubin Urine: NEGATIVE
Glucose, UA: NEGATIVE mg/dL
Hgb urine dipstick: NEGATIVE
Ketones, ur: NEGATIVE mg/dL
Leukocytes,Ua: NEGATIVE
Nitrite: NEGATIVE
Protein, ur: NEGATIVE mg/dL
Specific Gravity, Urine: 1.015 (ref 1.005–1.030)
pH: 7.5 (ref 5.0–8.0)

## 2024-01-19 LAB — LIPASE, BLOOD: Lipase: 20 U/L (ref 11–51)

## 2024-01-19 MED ORDER — CIPROFLOXACIN HCL 500 MG PO TABS: 500.0000 mg | ORAL_TABLET | Freq: Two times a day (BID) | ORAL | 0 refills | Status: DC

## 2024-01-19 MED ORDER — ONDANSETRON 4 MG PO TBDP: 4.0000 mg | ORAL_TABLET | Freq: Three times a day (TID) | ORAL | 0 refills | Status: AC | PRN

## 2024-01-19 MED ORDER — LACTATED RINGERS IV BOLUS
1000.0000 mL | Freq: Once | INTRAVENOUS | Status: AC
  Administered 2024-01-19: 1000 mL via INTRAVENOUS

## 2024-01-19 MED ORDER — IOHEXOL 300 MG/ML  SOLN
125.0000 mL | Freq: Once | INTRAMUSCULAR | Status: AC | PRN
  Administered 2024-01-19: 125 mL via INTRAVENOUS

## 2024-01-19 MED ORDER — ONDANSETRON HCL 4 MG/2ML IJ SOLN
4.0000 mg | Freq: Once | INTRAMUSCULAR | Status: AC
  Administered 2024-01-19: 4 mg via INTRAVENOUS
  Filled 2024-01-19: qty 2

## 2024-01-19 MED ORDER — ONDANSETRON 4 MG PO TBDP: 4.0000 mg | ORAL_TABLET | Freq: Three times a day (TID) | ORAL | 0 refills | Status: DC | PRN

## 2024-01-19 MED ORDER — CIPROFLOXACIN HCL 500 MG PO TABS: 500.0000 mg | ORAL_TABLET | Freq: Two times a day (BID) | ORAL | 0 refills | Status: AC

## 2024-01-19 MED ORDER — METRONIDAZOLE 500 MG PO TABS: 500.0000 mg | ORAL_TABLET | Freq: Two times a day (BID) | ORAL | 0 refills | Status: DC

## 2024-01-19 MED ORDER — POTASSIUM CHLORIDE CRYS ER 20 MEQ PO TBCR
40.0000 meq | EXTENDED_RELEASE_TABLET | Freq: Once | ORAL | Status: AC
  Administered 2024-01-19: 40 meq via ORAL
  Filled 2024-01-19: qty 2

## 2024-01-19 MED ORDER — FENTANYL CITRATE (PF) 50 MCG/ML IJ SOSY
50.0000 ug | PREFILLED_SYRINGE | Freq: Once | INTRAMUSCULAR | Status: AC
  Administered 2024-01-19: 50 ug via INTRAVENOUS
  Filled 2024-01-19: qty 1

## 2024-01-19 MED ORDER — OXYCODONE-ACETAMINOPHEN 5-325 MG PO TABS
1.0000 | ORAL_TABLET | Freq: Once | ORAL | Status: AC
  Administered 2024-01-19: 1 via ORAL
  Filled 2024-01-19: qty 1

## 2024-01-19 MED ORDER — METRONIDAZOLE 500 MG PO TABS: 500.0000 mg | ORAL_TABLET | Freq: Two times a day (BID) | ORAL | 0 refills | Status: AC

## 2024-01-19 MED ORDER — OXYCODONE-ACETAMINOPHEN 5-325 MG PO TABS: 1.0000 | ORAL_TABLET | Freq: Three times a day (TID) | ORAL | 0 refills | Status: AC | PRN

## 2024-01-19 MED ORDER — OXYCODONE-ACETAMINOPHEN 5-325 MG PO TABS: 1.0000 | ORAL_TABLET | Freq: Three times a day (TID) | ORAL | 0 refills | Status: DC | PRN

## 2024-01-19 NOTE — ED Triage Notes (Signed)
 Pt c/o abd pain, constipation, nausea x 1 day. Also reports dysuria x 1 day.   LBM yesterday.

## 2024-01-19 NOTE — Discharge Instructions (Addendum)
 Your CT scan showed evidence of diverticulitis for which you have been prescribed antibiotics, nausea medicine and pain medicine. MPRESSION:  1. Acute uncomplicated diverticulitis of the mid sigmoid colon.  Close follow-up is recommended to ensure resolution and exclude  underlying colonic mass.  2. Age indeterminate L2 compression fracture, likely chronic.  3.  Aortic Atherosclerosis (ICD10-I70.0).   Follow-up with your primary care provider regarding these findings to ensure resolution, return to the emergency department for any severe worsening symptoms.

## 2024-01-19 NOTE — ED Provider Notes (Signed)
 " [facility] Provider Note   CSN: [id number] Arrival date & time: 01/19/24  1616     Patient presents with: Abdominal Pain and Nausea   Kimberly Gibbs is a 74 y.o. female.    Abdominal Pain Associated symptoms: dysuria and nausea      74 year old female with medical history significant for asthma/COPD, HTN, diverticulitis presenting to the emergency department with abdominal pain and nausea.  The patient states that for the past day she has had suprapubic abdominal discomfort with some radiation to the right lower quadrant.  She has had associated nausea, denies any vomiting.  She has had chills, no fevers.  Her last bowel movement was yesterday and was particularly hard.  She denies any rectal pain.  She endorses pain on urination but is able to urinate.  No vomiting.  Denies any chest pain, shortness of breath, sore throat, nasal congestion.  Prior to Admission medications  Medication Sig Start Date End Date Taking? Authorizing Provider  ciprofloxacin  (CIPRO ) 500 MG tablet Take 1 tablet (500 mg total) by mouth every 12 (twelve) hours for 7 days. 01/19/24 01/26/24 Yes Jerrol Agent, MD  metroNIDAZOLE  (FLAGYL ) 500 MG tablet Take 1 tablet (500 mg total) by mouth 2 (two) times daily for 7 days. 01/19/24 01/26/24 Yes Jerrol Agent, MD  ondansetron  (ZOFRAN -ODT) 4 MG disintegrating tablet Take 1 tablet (4 mg total) by mouth every 8 (eight) hours as needed. 01/19/24  Yes Jerrol Agent, MD  oxyCODONE -acetaminophen  (PERCOCET/ROXICET) 5-325 MG tablet Take 1 tablet by mouth every 8 (eight) hours as needed for severe pain (pain score 7-10). 01/19/24  Yes Jerrol Agent, MD  albuterol  (PROVENTIL ) (2.5 MG/3ML) 0.083% nebulizer solution Take 3 mLs (2.5 mg total) by nebulization every 6 (six) hours as needed for wheezing or shortness of breath. 04/18/20   Christopher Savannah, PA-C  albuterol  (VENTOLIN  HFA) 108 (90 Base) MCG/ACT inhaler Inhale 1-2 puffs into the lungs  every 6 (six) hours as needed for wheezing or shortness of breath. 04/18/20   Christopher Savannah, PA-C  BREO ELLIPTA 200-25 MCG/INH AEPB Inhale 1 puff into the lungs daily. 12/08/19   [provider]  diclofenac  Sodium (VOLTAREN ) 1 % GEL apply 4 gram to painful knees up to 4 times daily if needed 07/06/21     DULoxetine  (CYMBALTA ) 30 MG capsule Take 1 (one) capsule by mouth daily with food 07/06/21     esomeprazole (NEXIUM) 40 MG capsule Take 40 mg by mouth daily.      [provider]  gabapentin (NEURONTIN) 300 MG capsule Take 300 mg by mouth 3 (three) times daily. 01/11/20   [provider]  Ipratropium-Albuterol  (COMBIVENT) 20-100 MCG/ACT AERS respimat Inhale 1 puff into the lungs every 6 (six) hours as needed for wheezing or shortness of breath. For shortness of breath    [provider]  levocetirizine (XYZAL ) 5 MG tablet Take 1 tablet (5 mg total) by mouth every evening. Patient not taking: Reported on 06/22/2021 04/18/20   Christopher Savannah, PA-C  losartan-hydrochlorothiazide (HYZAAR) 100-25 MG tablet Take 1 tablet by mouth daily. 02/03/20   [provider]  metoprolol  tartrate (LOPRESSOR ) 50 MG tablet Take 1 tablet (50 mg total) by mouth once for 1 dose. Take 2 hr prior to CT scan 04/16/21 04/16/21  Duran, Michael R, PA-C  metoprolol  tartrate (LOPRESSOR ) 50 MG tablet Take tablet (50mg ) TWO hours prior to your cardiac CT scan. Hold your Losartan. 06/21/21   O'Neal, Darryle Ned, MD  montelukast  (SINGULAIR )  10 MG tablet Take 1 tablet (10 mg total) by mouth at bedtime. 04/18/20   Christopher Savannah, PA-C  nitroGLYCERIN  (NITROSTAT ) 0.4 MG SL tablet Place 0.4 mg under the tongue every 5 (five) minutes as needed for chest pain. 02/10/20   [provider]  NURTEC 75 MG TBDP Take 1 tablet by mouth daily as needed. 01/11/20   [provider]  oxyCODONE  (ROXICODONE ) 15 MG immediate release tablet Take 15 mg by mouth 5 (five) times daily. 02/10/20   [provider]   oxyCODONE  (ROXICODONE ) 15 MG immediate release tablet Take 1 (one) Tablet 5 times a day, max daily dose: 5 Tablet 07/06/21     potassium chloride  (KLOR-CON ) 10 MEQ tablet Take 1 tablet (10 mEq total) by mouth daily for 7 days. Patient not taking: Reported on 02/21/2020 02/05/19 02/12/19  Venter, Margaux, PA-C  potassium chloride  SA (KLOR-CON ) 20 MEQ tablet Take 20 mEq by mouth daily.    [provider]  potassium chloride  SA (KLOR-CON ) 20 MEQ tablet Take 2 tablets (40 mEq total) by mouth 2 (two) times daily. 02/21/20   Tegeler, Lonni PARAS, MD  predniSONE  (DELTASONE ) 50 MG tablet Take 1 tablet (50 mg total) by mouth daily with breakfast. 06/21/20   Christopher Savannah, PA-C  SPIRIVA RESPIMAT 1.25 MCG/ACT AERS Inhale 2 puffs into the lungs daily. 12/08/19   [provider]  theophylline (UNIPHYL) 400 MG 24 hr tablet Take 400 mg by mouth 2 (two) times daily. 02/04/20   [provider]  traZODone (DESYREL) 100 MG tablet Take 100 mg by mouth at bedtime. Patient not taking: Reported on 06/22/2021 11/11/19   [provider]  TROKENDI XR 50 MG CP24 Take 1 capsule by mouth daily. Patient not taking: Reported on 06/22/2021 02/10/20   [provider]  verapamil (CALAN-SR) 120 MG CR tablet Take 120 mg by mouth daily. 02/08/20   [provider]  VIBERZI 100 MG TABS Take 1 tablet by mouth 2 (two) times daily. Patient not taking: Reported on 06/22/2021 10/11/19   [provider]    Allergies: Aspirin, Latex, Penicillins, and Ampicillin    Review of Systems  Gastrointestinal:  Positive for abdominal pain and nausea.  Genitourinary:  Positive for dysuria.  All other systems reviewed and are negative.   Updated Vital Signs BP 112/70   Pulse (!) 104   Temp 98.2 F (36.8 C) (Oral)   Resp 20   Ht 5' 6 (1.676 m)   Wt 106.6 kg   SpO2 96%   BMI 37.93 kg/m   Physical Exam Vitals and nursing note reviewed.  Constitutional:      General: She is not in acute  distress.    Appearance: She is well-developed.  HENT:     Head: Normocephalic and atraumatic.  Eyes:     Conjunctiva/sclera: Conjunctivae normal.  Cardiovascular:     Rate and Rhythm: Normal rate and regular rhythm.     Heart sounds: No murmur heard. Pulmonary:     Effort: Pulmonary effort is normal. No respiratory distress.     Breath sounds: Wheezing present.     Comments: Faint expiratory wheezing present Abdominal:     Palpations: Abdomen is soft.     Tenderness: There is abdominal tenderness in the right lower quadrant and suprapubic area. There is no guarding.  Musculoskeletal:        General: No swelling.     Cervical back: Neck supple.  Skin:    General: Skin is warm and dry.  Capillary Refill: Capillary refill takes less than 2 seconds.  Neurological:     Mental Status: She is alert.  Psychiatric:        Mood and Affect: Mood normal.     (all labs ordered are listed, but only abnormal results are displayed) Labs Reviewed  CBC - Abnormal; Notable for the following components:      Result Value   WBC 11.3 (*)    All other components within normal limits  COMPREHENSIVE METABOLIC PANEL WITH GFR - Abnormal; Notable for the following components:   Potassium 3.2 (*)    Glucose, Bld 145 (*)    Calcium 8.7 (*)    All other components within normal limits  URINALYSIS, ROUTINE W REFLEX MICROSCOPIC  LIPASE, BLOOD    EKG: None  Radiology: CT ABDOMEN PELVIS W CONTRAST Result Date: 01/19/2024 CLINICAL DATA:  Abdominal pain, constipation, nausea EXAM: CT ABDOMEN AND PELVIS WITH CONTRAST TECHNIQUE: Multidetector CT imaging of the abdomen and pelvis was performed using the standard protocol following bolus administration of intravenous contrast. RADIATION DOSE REDUCTION: This exam was performed according to the departmental dose-optimization program which includes automated exposure control, adjustment of the mA and/or kV according to patient size and/or use of iterative  reconstruction technique. CONTRAST:  OMNIPAQUE  IOHEXOL  300 MG/ML  SOLN COMPARISON:  None Available. FINDINGS: Lower chest: No acute pleural or parenchymal lung disease. Hepatobiliary: No focal liver abnormality is seen. No gallstones, gallbladder wall thickening, or biliary dilatation. Pancreas: Unremarkable. No pancreatic ductal dilatation or surrounding inflammatory changes. Spleen: Normal in size without focal abnormality. Adrenals/Urinary Tract: Adrenal glands are unremarkable. Kidneys are normal, without renal calculi, focal lesion, or hydronephrosis. Bladder is unremarkable. Stomach/Bowel: No bowel obstruction or ileus. Normal appendix right lower quadrant. Diverticulosis throughout the descending and sigmoid colon. There is segmental wall thickening and pericolonic fat stranding involving the mid sigmoid colon, reference image 60/2. Findings are most consistent with acute uncomplicated diverticulitis, though follow-up is recommended to ensure resolution and exclude underlying colonic mass. Vascular/Lymphatic: Aortic atherosclerosis. No enlarged abdominal or pelvic lymph nodes. Reproductive: Uterus and bilateral adnexa are unremarkable. Other: No free fluid or free intraperitoneal gas. No abdominal wall hernia. Musculoskeletal: There is a chronic appearing compression deformity through the superior endplate of the L2 vertebral body, with approximately 50% loss of height. No retropulsion. Reconstructed images demonstrate no additional findings. IMPRESSION: 1. Acute uncomplicated diverticulitis of the mid sigmoid colon. Close follow-up is recommended to ensure resolution and exclude underlying colonic mass. 2. Age indeterminate L2 compression fracture, likely chronic. 3.  Aortic Atherosclerosis (ICD10-I70.0). Electronically Signed   By: Ozell Daring M.D.   On: 01/19/2024 20:34     Procedures   Medications Ordered in the ED  oxyCODONE -acetaminophen  (PERCOCET/ROXICET) 5-325 MG per tablet 1 tablet  (has no administration in time range)  potassium chloride  SA (KLOR-CON  M) CR tablet 40 mEq (has no administration in time range)  ondansetron  (ZOFRAN ) injection 4 mg (4 mg Intravenous Given 01/19/24 1815)  lactated ringers  bolus 1,000 mL (1,000 mLs Intravenous New Bag/Given 01/19/24 1816)  fentaNYL  (SUBLIMAZE ) injection 50 mcg (50 mcg Intravenous Given 01/19/24 1815)  iohexol  (OMNIPAQUE ) 300 MG/ML solution 125 mL (125 mLs Intravenous Contrast Given 01/19/24 1917)                                    Medical Decision Making Amount and/or Complexity of Data Reviewed Labs: ordered. Radiology: ordered.  Risk Prescription  drug management.    74 year old female with medical history significant for asthma/COPD, HTN, diverticulitis presenting to the emergency department with abdominal pain and nausea.  The patient states that for the past day she has had suprapubic abdominal discomfort with some radiation to the right lower quadrant.  She has had associated nausea, denies any vomiting.  She has had chills, no fevers.  Her last bowel movement was yesterday and was particularly hard.  She denies any rectal pain.  She endorses pain on urination but is able to urinate.  No vomiting.  Denies any chest pain, shortness of breath, sore throat, nasal congestion.  On arrival, the patient was afebrile, not tachycardic or tachypneic, initial blood pressure 94/73, saturating 93% on room air.  Patient has right lower quadrant tenderness to palpation on exam.  Workup initiated to evaluate for diverticulitis versus bowel obstruction versus constipation.  Labs: Lipase normal, CBC with a nonspecific leukocytosis to 11.3, no anemia, CMP with mild hypokalemia to 3.2, replenished orally.  CT abdomen pelvis: MPRESSION:  1. Acute uncomplicated diverticulitis of the mid sigmoid colon.  Close follow-up is recommended to ensure resolution and exclude  underlying colonic mass.  2. Age indeterminate L2 compression fracture,  likely chronic.  3.  Aortic Atherosclerosis (ICD10-I70.0).    Patient found to have acute uncomplicated diverticulitis.  Considered admission for observation versus discharge and patient is comfortable plan for discharge at this time.  Advise close follow-up with her primary care provider, return precautions provided for any severe worsening symptoms.  Will discharge on Cipro  and Flagyl , Zofran  and ODT as well as a short course of opiates for breakthrough pain.     Final diagnoses:  Diverticulitis    ED Discharge Orders          Ordered    ciprofloxacin  (CIPRO ) 500 MG tablet  Every 12 hours        01/19/24 2051    metroNIDAZOLE  (FLAGYL ) 500 MG tablet  2 times daily        01/19/24 2051    oxyCODONE -acetaminophen  (PERCOCET/ROXICET) 5-325 MG tablet  Every 8 hours PRN        01/19/24 2051    ondansetron  (ZOFRAN -ODT) 4 MG disintegrating tablet  Every 8 hours PRN        01/19/24 2051               Jerrol Agent, MD 01/19/24 2054  "

## 2024-01-19 NOTE — ED Notes (Signed)
 Need redraw light green tube
